# Patient Record
Sex: Male | Born: 1935 | Race: White | Hispanic: No | Smoking: Never smoker
Health system: Southern US, Community
[De-identification: ages and names within clinical notes are randomized; demographics above are authoritative.]

## PROBLEM LIST (undated history)

## (undated) DIAGNOSIS — F419 Anxiety disorder, unspecified: Secondary | ICD-10-CM

---

## 2012-12-27 DIAGNOSIS — R4182 Altered mental status, unspecified: Secondary | ICD-10-CM

## 2018-03-16 ENCOUNTER — Inpatient Hospital Stay (HOSPITAL_COMMUNITY)
Admission: EM | Admit: 2018-03-16 | Discharge: 2018-03-22 | DRG: 918 | Disposition: A | Discharge status: Skilled Nursing Facility | Payer: Medicare Other | Attending: Internal Medicine | Admitting: Internal Medicine

## 2018-03-16 DIAGNOSIS — D696 Thrombocytopenia, unspecified: Secondary | ICD-10-CM | POA: Diagnosis present

## 2018-03-16 DIAGNOSIS — D649 Anemia, unspecified: Secondary | ICD-10-CM

## 2018-03-16 DIAGNOSIS — Z23 Encounter for immunization: Secondary | ICD-10-CM

## 2018-03-16 DIAGNOSIS — F17290 Nicotine dependence, other tobacco product, uncomplicated: Secondary | ICD-10-CM | POA: Diagnosis present

## 2018-03-16 DIAGNOSIS — E162 Hypoglycemia, unspecified: Secondary | ICD-10-CM | POA: Diagnosis not present

## 2018-03-16 DIAGNOSIS — Z6830 Body mass index (BMI) 30.0-30.9, adult: Secondary | ICD-10-CM

## 2018-03-16 DIAGNOSIS — N185 Chronic kidney disease, stage 5: Secondary | ICD-10-CM

## 2018-03-16 DIAGNOSIS — E785 Hyperlipidemia, unspecified: Secondary | ICD-10-CM | POA: Diagnosis present

## 2018-03-16 DIAGNOSIS — D631 Anemia in chronic kidney disease: Secondary | ICD-10-CM | POA: Diagnosis present

## 2018-03-16 DIAGNOSIS — E1122 Type 2 diabetes mellitus with diabetic chronic kidney disease: Secondary | ICD-10-CM | POA: Diagnosis present

## 2018-03-16 DIAGNOSIS — Z7901 Long term (current) use of anticoagulants: Secondary | ICD-10-CM

## 2018-03-16 DIAGNOSIS — I132 Hypertensive heart and chronic kidney disease with heart failure and with stage 5 chronic kidney disease, or end stage renal disease: Secondary | ICD-10-CM | POA: Diagnosis present

## 2018-03-16 DIAGNOSIS — T383X1A Poisoning by insulin and oral hypoglycemic [antidiabetic] drugs, accidental (unintentional), initial encounter: Principal | ICD-10-CM | POA: Diagnosis present

## 2018-03-16 DIAGNOSIS — F32A Depression, unspecified: Secondary | ICD-10-CM

## 2018-03-16 DIAGNOSIS — Z7984 Long term (current) use of oral hypoglycemic drugs: Secondary | ICD-10-CM

## 2018-03-16 DIAGNOSIS — I5032 Chronic diastolic (congestive) heart failure: Secondary | ICD-10-CM | POA: Diagnosis present

## 2018-03-16 DIAGNOSIS — J449 Chronic obstructive pulmonary disease, unspecified: Secondary | ICD-10-CM | POA: Diagnosis present

## 2018-03-16 DIAGNOSIS — I10 Essential (primary) hypertension: Secondary | ICD-10-CM

## 2018-03-16 DIAGNOSIS — I272 Pulmonary hypertension, unspecified: Secondary | ICD-10-CM | POA: Diagnosis present

## 2018-03-16 DIAGNOSIS — F329 Major depressive disorder, single episode, unspecified: Secondary | ICD-10-CM | POA: Diagnosis present

## 2018-03-16 DIAGNOSIS — E11649 Type 2 diabetes mellitus with hypoglycemia without coma: Secondary | ICD-10-CM | POA: Diagnosis present

## 2018-03-16 DIAGNOSIS — N400 Enlarged prostate without lower urinary tract symptoms: Secondary | ICD-10-CM | POA: Diagnosis present

## 2018-03-16 DIAGNOSIS — I480 Paroxysmal atrial fibrillation: Secondary | ICD-10-CM | POA: Diagnosis present

## 2018-03-16 DIAGNOSIS — Z993 Dependence on wheelchair: Secondary | ICD-10-CM

## 2018-03-16 DIAGNOSIS — G8929 Other chronic pain: Secondary | ICD-10-CM | POA: Diagnosis present

## 2018-03-16 DIAGNOSIS — I509 Heart failure, unspecified: Secondary | ICD-10-CM

## 2018-03-16 LAB — CBC
HCT: 32 % — ABNORMAL LOW (ref 39.0–52.0)
HEMOGLOBIN: 9.5 g/dL — AB (ref 13.0–17.0)
MCH: 32.5 pg (ref 26.0–34.0)
MCHC: 29.7 g/dL — AB (ref 30.0–36.0)
MCV: 109.6 fL — ABNORMAL HIGH (ref 80.0–100.0)
Platelets: 121 10*3/uL — ABNORMAL LOW (ref 150–400)
RBC: 2.92 MIL/uL — ABNORMAL LOW (ref 4.22–5.81)
RDW: 13.5 % (ref 11.5–15.5)
WBC: 6.8 10*3/uL (ref 4.0–10.5)
nRBC: 0 % (ref 0.0–0.2)

## 2018-03-16 LAB — COMPREHENSIVE METABOLIC PANEL
ALBUMIN: 3.1 g/dL — AB (ref 3.5–5.0)
ALK PHOS: 90 U/L (ref 38–126)
ALT: 22 U/L (ref 0–44)
ANION GAP: 7 (ref 5–15)
AST: 38 U/L (ref 15–41)
BUN: 67 mg/dL — ABNORMAL HIGH (ref 8–23)
CALCIUM: 8.6 mg/dL — AB (ref 8.9–10.3)
CHLORIDE: 103 mmol/L (ref 98–111)
CO2: 28 mmol/L (ref 22–32)
Creatinine, Ser: 4.55 mg/dL — ABNORMAL HIGH (ref 0.61–1.24)
GFR calc Af Amer: 12 mL/min — ABNORMAL LOW (ref >60)
GFR calc non Af Amer: 11 mL/min — ABNORMAL LOW (ref >60)
GLUCOSE: 87 mg/dL (ref 70–99)
Potassium: 4 mmol/L (ref 3.5–5.1)
SODIUM: 138 mmol/L (ref 135–145)
Total Bilirubin: 0.6 mg/dL (ref 0.3–1.2)
Total Protein: 6.1 g/dL — ABNORMAL LOW (ref 6.5–8.1)

## 2018-03-16 LAB — CBG MONITORING, ED
GLUCOSE-CAPILLARY: 60 mg/dL — AB (ref 70–99)
Glucose-Capillary: 41 mg/dL — CL (ref 70–99)
Glucose-Capillary: 66 mg/dL — ABNORMAL LOW (ref 70–99)

## 2018-03-16 MED ORDER — SODIUM CHLORIDE 0.9 % IV SOLN: 250.0000 mL | INTRAVENOUS | Status: DC | PRN

## 2018-03-16 MED ORDER — DEXTROSE 10 % IV SOLN
INTRAVENOUS | Status: DC
  Administered 2018-03-16: 23:00:00 via INTRAVENOUS

## 2018-03-16 MED ORDER — DEXTROSE 50 % IV SOLN
25.0000 mL | Freq: Once | INTRAVENOUS | Status: AC
  Administered 2018-03-16: 25 mL via INTRAVENOUS
  Filled 2018-03-16: qty 50

## 2018-03-16 MED ORDER — SODIUM CHLORIDE 0.9% FLUSH
3.0000 mL | Freq: Two times a day (BID) | INTRAVENOUS | Status: DC
  Administered 2018-03-17 – 2018-03-22 (×11): 3 mL via INTRAVENOUS

## 2018-03-16 MED ORDER — SODIUM CHLORIDE 0.9% FLUSH: 3.0000 mL | INTRAVENOUS | Status: DC | PRN

## 2018-03-16 NOTE — ED Triage Notes (Signed)
Pt from home with [facility] for hypoglycemia. Family called 911 due to pt unconscious. CBg 36 upon ems arrival. Pt given 25G Dextrose.. Increased to 140s, en route CBG dropped to 60, pt on D10 gtt upon arrival to ED. CBG 60 in ED. Pt a.ox4 at this time. Given oral glucose.    BP178/104 Hr70

## 2018-03-16 NOTE — ED Provider Notes (Signed)
[place] EMERGENCY DEPARTMENT Provider Note   CSN: [id number] Arrival date & time: 03/16/18  2103     History   Chief Complaint Chief Complaint  Patient presents with  . Hypoglycemia    HPI Justin Christensen is a 84 y.o. male.  HPI She presented to the emergency room for evaluation of hypoglycemia.  Patient lives at home with his daughter.  According to the EMS report the patient was unresponsive.  EMS checked his CBG and it was 36.  They administered 25 g of dextrose.  Patient's blood sugar increased to the 140s but it started decreasing down to 60.  They started D10 drip.  In the ED the patient's blood sugar was 60.  Patient denies any complaints today.  He states he was feeling fine.  He denies any nausea vomiting or diarrhea.  He denies any chest pain, fevers or shortness of breath.  Patient states he has been taking his medications regularly  PMHX: Renal failure, congestive heart failure, chronic kidney disease, diabetes   Social history: Patient smokes a pipe, patient does not drink alcohol      Home Medications    Prior to Admission medications   Medication Sig Start Date End Date Taking? Authorizing Provider  acetaminophen (TYLENOL) 325 MG tablet Take 325 mg by mouth every 6 (six) hours as needed (for pain or headaches).   Yes [provider]  albuterol (PROVENTIL HFA) 108 (90 Base) MCG/ACT inhaler Inhale 1-2 puffs into the lungs every 6 (six) hours as needed for wheezing or shortness of breath.   Yes [provider]  albuterol (PROVENTIL) (2.5 MG/3ML) 0.083% nebulizer solution Take 2.5 mg by nebulization every 6 (six) hours as needed for wheezing or shortness of breath.   Yes [provider]  apixaban (ELIQUIS) 2.5 MG TABS tablet Take 2.5 mg by mouth 2 (two) times daily.   Yes [provider]  DULoxetine (CYMBALTA) 30 MG capsule Take 30 mg by mouth daily.   Yes [provider]  glipiZIDE (GLUCOTROL) 10 MG  tablet Take 10 mg by mouth 2 (two) times daily before a meal.   Yes [provider]  hydrALAZINE (APRESOLINE) 25 MG tablet Take 25 mg by mouth 3 (three) times daily.   Yes [provider]  HYDROcodone-acetaminophen (NORCO/VICODIN) 5-325 MG tablet Take 1 tablet by mouth every 8 (eight) hours as needed (for pain).   Yes [provider]  Multiple Vitamins-Minerals (ONE-A-DAY MENS 50+ ADVANTAGE) TABS Take 1 tablet by mouth daily.   Yes [provider]  simethicone (MYLICON) 80 MG chewable tablet Chew 80 mg by mouth every 6 (six) hours as needed for flatulence.   Yes [provider]  simvastatin (ZOCOR) 40 MG tablet Take 40 mg by mouth at bedtime.   Yes [provider]  terazosin (HYTRIN) 5 MG capsule Take 5 mg by mouth at bedtime.   Yes [provider]  Tiotropium Bromide Monohydrate (SPIRIVA RESPIMAT) 1.25 MCG/ACT AERS Inhale 1 puff into the lungs daily.   Yes [provider]  methocarbamol (ROBAXIN) 500 MG tablet Take 500 mg by mouth 3 (three) times daily as needed for muscle spasms.    [provider]  Home medications Per last discharge summary in care everywhere, hydralazine, Eliquis, Cymbalta, Glucotrol, Norco, Robaxin, Zocor Spiriva, Mylicon, testerone  Family History No family history on file.   Allergies   Patient has no known allergies.   Review of Systems Review of Systems  All other systems  reviewed and are negative.    Physical Exam Updated Vital Signs BP (!) 162/94   Pulse 64   Temp (!) 97.4 F (36.3 C) (Oral)   Resp 19   SpO2 100%   Physical Exam  Constitutional:  Really, frail  HENT:  Head: Normocephalic and atraumatic.  Right Ear: External ear normal.  Left Ear: External ear normal.  Eyes: Conjunctivae are normal. Right eye exhibits no discharge. Left eye exhibits no discharge. No scleral icterus.  Neck: Neck supple. No tracheal deviation present.  Cardiovascular: Normal rate,  regular rhythm and intact distal pulses.  Pulmonary/Chest: Effort normal and breath sounds normal. No stridor. No respiratory distress. He has no wheezes. He has no rales.  Abdominal: Soft. Bowel sounds are normal. He exhibits no distension. There is no tenderness. There is no rebound and no guarding.  Musculoskeletal: He exhibits edema ( Lateral lower extremities). He exhibits no tenderness.  Neurological: He is alert. He has normal strength. No cranial nerve deficit (no facial droop, extraocular movements intact, no slurred speech) or sensory deficit. He exhibits normal muscle tone. He displays no seizure activity. Coordination normal.  Skin: Skin is warm and dry. No rash noted.  Small petechial lesions in the skin  Psychiatric: He has a normal mood and affect.  Nursing note and vitals reviewed.    ED Treatments / Results  Labs (all labs ordered are listed, but only abnormal results are displayed) Labs Reviewed  CBC - Abnormal; Notable for the following components:      Result Value   RBC 2.92 (*)    Hemoglobin 9.5 (*)    HCT 32.0 (*)    MCV 109.6 (*)    MCHC 29.7 (*)    Platelets 121 (*)    All other components within normal limits  COMPREHENSIVE METABOLIC PANEL - Abnormal; Notable for the following components:   BUN 67 (*)    Creatinine, Ser 4.55 (*)    Calcium 8.6 (*)    Total Protein 6.1 (*)    Albumin 3.1 (*)    GFR calc non Af Amer 11 (*)    GFR calc Af Amer 12 (*)    All other components within normal limits  CBG MONITORING, ED - Abnormal; Notable for the following components:   Glucose-Capillary 60 (*)    All other components within normal limits  CBG MONITORING, ED - Abnormal; Notable for the following components:   Glucose-Capillary 41 (*)    All other components within normal limits  CBG MONITORING, ED  CBG MONITORING, ED    EKG None  Radiology No results found.  Procedures .Critical Care Performed by: Dorie Rank, MD Authorized by: Dorie Rank, MD    Critical care provider statement:    Critical care time (minutes):  45   Critical care was time spent personally by me on the following activities:  Discussions with consultants, evaluation of patient's response to treatment, examination of patient, ordering and performing treatments and interventions, ordering and review of laboratory studies, ordering and review of radiographic studies, pulse oximetry, re-evaluation of patient's condition, obtaining history from patient or surrogate and review of old charts   (including critical care time)  Medications Ordered in ED Medications  sodium chloride flush (NS) 0.9 % injection 3 mL (3 mLs Intravenous Not Given 03/16/18 2232)  sodium chloride flush (NS) 0.9 % injection 3 mL (has no administration in time range)  0.9 %  sodium chloride infusion (has no administration in time range)  dextrose 10 %  infusion ( Intravenous New Bag/Given 03/16/18 2242)  dextrose 50 % solution 25 mL (25 mLs Intravenous Given 03/16/18 2240)     Initial Impression / Assessment and Plan / ED Course  I have reviewed the triage vital signs and the nursing notes.  Pertinent labs & imaging results that were available during my care of the patient were reviewed by me and considered in my medical decision making (see chart for details).  Clinical Course as of Mar 16 2313  Tue Mar 16, 2018  2219 October 5 BUN and creatinine were 78/5   [JK]  2220 Hemoglobin was 10 on October 1   [JK]  2235 Blood sugar down to 40 again.  Will give d50 and start dextrose infusion again.   [JK]    Clinical Course User Index [JK] Dorie Rank, MD   Patient presented to the emergency room for evaluation of hypoglycemia.  Patient was recently in the hospital and had a change in his medications.  Patient's family indicated that he was hypoglycemic earlier this morning.  They did give him his diabetes medications following that he was eating and doing fine.  Patient had another episode of  hypoglycemia evening.  Patient unfortunately has remained hypoglycemic and has required IV insulin infusions.  Because of his persistent hypoglycemia and chronic renal failure I will consult the medical service for admission so we can continue to monitor his blood sugar overnight.  He is at risk for prolonged hypoglycemia  Final Clinical Impressions(s) / ED Diagnoses   Final diagnoses:  Hypoglycemia      Dorie Rank, MD 03/16/18 2315

## 2018-03-17 ENCOUNTER — Other Ambulatory Visit: Payer: Self-pay

## 2018-03-17 ENCOUNTER — Encounter (HOSPITAL_COMMUNITY): Payer: Self-pay

## 2018-03-17 DIAGNOSIS — I10 Essential (primary) hypertension: Secondary | ICD-10-CM

## 2018-03-17 DIAGNOSIS — D649 Anemia, unspecified: Secondary | ICD-10-CM

## 2018-03-17 DIAGNOSIS — N400 Enlarged prostate without lower urinary tract symptoms: Secondary | ICD-10-CM

## 2018-03-17 DIAGNOSIS — D696 Thrombocytopenia, unspecified: Secondary | ICD-10-CM

## 2018-03-17 DIAGNOSIS — N185 Chronic kidney disease, stage 5: Secondary | ICD-10-CM

## 2018-03-17 DIAGNOSIS — I480 Paroxysmal atrial fibrillation: Secondary | ICD-10-CM

## 2018-03-17 DIAGNOSIS — E162 Hypoglycemia, unspecified: Secondary | ICD-10-CM

## 2018-03-17 DIAGNOSIS — J449 Chronic obstructive pulmonary disease, unspecified: Secondary | ICD-10-CM

## 2018-03-17 DIAGNOSIS — I509 Heart failure, unspecified: Secondary | ICD-10-CM

## 2018-03-17 DIAGNOSIS — E785 Hyperlipidemia, unspecified: Secondary | ICD-10-CM

## 2018-03-17 DIAGNOSIS — G8929 Other chronic pain: Secondary | ICD-10-CM

## 2018-03-17 DIAGNOSIS — F32A Depression, unspecified: Secondary | ICD-10-CM

## 2018-03-17 DIAGNOSIS — F329 Major depressive disorder, single episode, unspecified: Secondary | ICD-10-CM

## 2018-03-17 LAB — GLUCOSE, CAPILLARY
GLUCOSE-CAPILLARY: 131 mg/dL — AB (ref 70–99)
GLUCOSE-CAPILLARY: 209 mg/dL — AB (ref 70–99)
GLUCOSE-CAPILLARY: 246 mg/dL — AB (ref 70–99)
GLUCOSE-CAPILLARY: 202 mg/dL — AB (ref 70–99)
Glucose-Capillary: 126 mg/dL — ABNORMAL HIGH (ref 70–99)
Glucose-Capillary: 251 mg/dL — ABNORMAL HIGH (ref 70–99)
Glucose-Capillary: 237 mg/dL — ABNORMAL HIGH (ref 70–99)

## 2018-03-17 LAB — CBG MONITORING, ED
GLUCOSE-CAPILLARY: 107 mg/dL — AB (ref 70–99)
GLUCOSE-CAPILLARY: 47 mg/dL — AB (ref 70–99)
Glucose-Capillary: 156 mg/dL — ABNORMAL HIGH (ref 70–99)
Glucose-Capillary: 59 mg/dL — ABNORMAL LOW (ref 70–99)
Glucose-Capillary: 59 mg/dL — ABNORMAL LOW (ref 70–99)
Glucose-Capillary: 80 mg/dL (ref 70–99)
Glucose-Capillary: 94 mg/dL (ref 70–99)

## 2018-03-17 LAB — MRSA PCR SCREENING: MRSA BY PCR: POSITIVE — AB

## 2018-03-17 LAB — HEMOGLOBIN A1C
Hgb A1c MFr Bld: 7.2 % — ABNORMAL HIGH (ref 4.8–5.6)
MEAN PLASMA GLUCOSE: 159.94 mg/dL

## 2018-03-17 MED ORDER — OCTREOTIDE ACETATE 50 MCG/ML IJ SOLN
50.0000 ug | Freq: Four times a day (QID) | INTRAMUSCULAR | Status: AC
  Administered 2018-03-17 (×2): 50 ug via SUBCUTANEOUS
  Filled 2018-03-17 (×3): qty 1

## 2018-03-17 MED ORDER — ADULT MULTIVITAMIN W/MINERALS CH
1.0000 | ORAL_TABLET | Freq: Every day | ORAL | Status: DC
  Administered 2018-03-17 – 2018-03-22 (×6): 1 via ORAL
  Filled 2018-03-17 (×6): qty 1

## 2018-03-17 MED ORDER — BOOST / RESOURCE BREEZE PO LIQD CUSTOM
1.0000 | Freq: Three times a day (TID) | ORAL | Status: DC
  Administered 2018-03-17: 1 via ORAL

## 2018-03-17 MED ORDER — DEXTROSE 50 % IV SOLN
INTRAVENOUS | Status: AC
  Administered 2018-03-17: 50 mL
  Filled 2018-03-17: qty 50

## 2018-03-17 MED ORDER — HYDRALAZINE HCL 25 MG PO TABS
25.0000 mg | ORAL_TABLET | Freq: Three times a day (TID) | ORAL | Status: DC
  Administered 2018-03-17 – 2018-03-18 (×4): 25 mg via ORAL
  Filled 2018-03-17 (×4): qty 1

## 2018-03-17 MED ORDER — INSULIN ASPART 100 UNIT/ML ~~LOC~~ SOLN
0.0000 [IU] | Freq: Three times a day (TID) | SUBCUTANEOUS | Status: DC
  Administered 2018-03-18 (×2): 3 [IU] via SUBCUTANEOUS
  Administered 2018-03-18 – 2018-03-19 (×3): 2 [IU] via SUBCUTANEOUS
  Administered 2018-03-20: 3 [IU] via SUBCUTANEOUS
  Administered 2018-03-20: 1 [IU] via SUBCUTANEOUS
  Administered 2018-03-20 – 2018-03-21 (×2): 3 [IU] via SUBCUTANEOUS
  Administered 2018-03-21: 5 [IU] via SUBCUTANEOUS
  Administered 2018-03-21 – 2018-03-22 (×2): 3 [IU] via SUBCUTANEOUS

## 2018-03-17 MED ORDER — ACETAMINOPHEN 325 MG PO TABS: 325.0000 mg | ORAL_TABLET | Freq: Four times a day (QID) | ORAL | Status: DC | PRN

## 2018-03-17 MED ORDER — HYDROCODONE-ACETAMINOPHEN 5-325 MG PO TABS
1.0000 | ORAL_TABLET | Freq: Three times a day (TID) | ORAL | Status: DC | PRN
  Administered 2018-03-17 – 2018-03-22 (×3): 1 via ORAL
  Filled 2018-03-17 (×3): qty 1

## 2018-03-17 MED ORDER — SIMVASTATIN 40 MG PO TABS
40.0000 mg | ORAL_TABLET | Freq: Every day | ORAL | Status: DC
  Administered 2018-03-17 – 2018-03-21 (×4): 40 mg via ORAL
  Filled 2018-03-17 (×6): qty 1

## 2018-03-17 MED ORDER — CHLORHEXIDINE GLUCONATE CLOTH 2 % EX PADS
6.0000 | MEDICATED_PAD | Freq: Every day | CUTANEOUS | Status: AC
  Administered 2018-03-17 – 2018-03-21 (×5): 6 via TOPICAL

## 2018-03-17 MED ORDER — SIMETHICONE 80 MG PO CHEW
80.0000 mg | CHEWABLE_TABLET | Freq: Four times a day (QID) | ORAL | Status: DC | PRN
  Filled 2018-03-17: qty 1

## 2018-03-17 MED ORDER — GLUCERNA SHAKE PO LIQD
237.0000 mL | Freq: Three times a day (TID) | ORAL | Status: DC
  Administered 2018-03-17 – 2018-03-22 (×11): 237 mL via ORAL

## 2018-03-17 MED ORDER — DEXTROSE 50 % IV SOLN
INTRAVENOUS | Status: AC
  Filled 2018-03-17: qty 50

## 2018-03-17 MED ORDER — DEXTROSE 10 % IV SOLN
INTRAVENOUS | Status: DC
  Administered 2018-03-17 (×2): via INTRAVENOUS

## 2018-03-17 MED ORDER — APIXABAN 2.5 MG PO TABS
2.5000 mg | ORAL_TABLET | Freq: Two times a day (BID) | ORAL | Status: DC
  Administered 2018-03-17 – 2018-03-22 (×11): 2.5 mg via ORAL
  Filled 2018-03-17 (×14): qty 1

## 2018-03-17 MED ORDER — TIOTROPIUM BROMIDE MONOHYDRATE 18 MCG IN CAPS
1.0000 | ORAL_CAPSULE | Freq: Every day | RESPIRATORY_TRACT | Status: DC
  Administered 2018-03-17 – 2018-03-20 (×4): 18 ug via RESPIRATORY_TRACT
  Filled 2018-03-17: qty 5

## 2018-03-17 MED ORDER — MUPIROCIN 2 % EX OINT
1.0000 "application " | TOPICAL_OINTMENT | Freq: Two times a day (BID) | CUTANEOUS | Status: AC
  Administered 2018-03-17 – 2018-03-21 (×10): 1 via NASAL
  Filled 2018-03-17 (×5): qty 22

## 2018-03-17 MED ORDER — INFLUENZA VAC SPLIT HIGH-DOSE 0.5 ML IM SUSY
0.5000 mL | PREFILLED_SYRINGE | INTRAMUSCULAR | Status: AC
  Administered 2018-03-18: 0.5 mL via INTRAMUSCULAR
  Filled 2018-03-17: qty 0.5

## 2018-03-17 MED ORDER — ORAL CARE MOUTH RINSE
15.0000 mL | Freq: Two times a day (BID) | OROMUCOSAL | Status: DC
  Administered 2018-03-17 – 2018-03-22 (×10): 15 mL via OROMUCOSAL

## 2018-03-17 MED ORDER — DEXTROSE 50 % IV SOLN
25.0000 mL | Freq: Once | INTRAVENOUS | Status: AC
  Administered 2018-03-17: 25 mL via INTRAVENOUS
  Filled 2018-03-17: qty 50

## 2018-03-17 MED ORDER — ALBUTEROL SULFATE (2.5 MG/3ML) 0.083% IN NEBU: 2.5000 mg | INHALATION_SOLUTION | Freq: Four times a day (QID) | RESPIRATORY_TRACT | Status: DC | PRN

## 2018-03-17 MED ORDER — PNEUMOCOCCAL VAC POLYVALENT 25 MCG/0.5ML IJ INJ
0.5000 mL | INJECTION | INTRAMUSCULAR | Status: AC
  Administered 2018-03-18: 0.5 mL via INTRAMUSCULAR
  Filled 2018-03-17: qty 0.5

## 2018-03-17 MED ORDER — DEXTROSE 50 % IV SOLN
1.0000 | Freq: Once | INTRAVENOUS | Status: AC
  Administered 2018-03-17: 50 mL via INTRAVENOUS

## 2018-03-17 MED ORDER — INSULIN ASPART 100 UNIT/ML ~~LOC~~ SOLN
0.0000 [IU] | Freq: Every day | SUBCUTANEOUS | Status: DC
  Administered 2018-03-20: 2 [IU] via SUBCUTANEOUS

## 2018-03-17 MED ORDER — TERAZOSIN HCL 5 MG PO CAPS
5.0000 mg | ORAL_CAPSULE | Freq: Every day | ORAL | Status: DC
  Administered 2018-03-17 – 2018-03-21 (×5): 5 mg via ORAL
  Filled 2018-03-17 (×7): qty 1

## 2018-03-17 MED ORDER — DULOXETINE HCL 30 MG PO CPEP
30.0000 mg | ORAL_CAPSULE | Freq: Every day | ORAL | Status: DC
  Administered 2018-03-17 – 2018-03-22 (×6): 30 mg via ORAL
  Filled 2018-03-17 (×6): qty 1

## 2018-03-17 NOTE — ED Notes (Signed)
Ordered breakfast 

## 2018-03-17 NOTE — H&P (Addendum)
History and Physical    Justin Christensen POE:[id number] DOB: 06/07/1931 DOA: 03/16/2018  PCP: [name] Patient coming from: Home  Chief Complaint: Patient is here for evaluation of low blood glucose readings at home.  HPI: Justin Christensen is a 84 y.o. male with medical history significant of type 2 diabetes, CHF, CKD stage V, hypertension, paroxysmal A. fib, COPD presenting to the hospital for further evaluation of low blood glucose readings at home.  Patient states he is feeling fine and does not know why he is here.  He thinks his daughter must have called EMS.  He denies having any headaches, palpitations, or dizziness.  Denies having any chest pain, shortness of breath, abdominal pain, nausea, vomiting, or diarrhea.  His home glipizide dose is 10 mg tablet 2 times a day.  Patient reports taking a much higher dose - 3 tablets of glipizide in the morning and 2 tablets in the afternoon.  Unclear for how long he has been on this dose.  Per chart review under care every where, patient was recently admitted at [facility] for acute kidney failure in the setting of CKD stage V.  ED Course: Per ED documentation, EMS found the patient unresponsive at home and his CBG was 36.  They administered 25 g of dextrose.  His blood glucose increased to 140s but then it started decreasing again down to 60.  Started D10 drip.  CBG on arrival 60. In the ED, patient's blood glucose continued to drop despite patient being on D10 infusion, receiving D50, and eating. [facility] called to admit.  Review of Systems: As per HPI otherwise 10 point review of systems negative.  Past medical history Type 2 diabetes, CHF, CKD stage V, hypertension, paroxysmal A. fib, COPD    Social history Unable to obtain from patient at this time.  Patient is not completely oriented.  has no tobacco, alcohol, and drug history on file.  No Known Allergies  Family history Unable to obtain from patient at this time.  Patient is not  completely oriented. No family history on file.  Prior to Admission medications   Medication Sig Start Date End Date Taking? Authorizing Provider  acetaminophen (TYLENOL) 325 MG tablet Take 325 mg by mouth every 6 (six) hours as needed (for pain or headaches).   Yes [provider]  albuterol (PROVENTIL HFA) 108 (90 Base) MCG/ACT inhaler Inhale 1-2 puffs into the lungs every 6 (six) hours as needed for wheezing or shortness of breath.   Yes [provider]  albuterol (PROVENTIL) (2.5 MG/3ML) 0.083% nebulizer solution Take 2.5 mg by nebulization every 6 (six) hours as needed for wheezing or shortness of breath.   Yes [provider]  apixaban (ELIQUIS) 2.5 MG TABS tablet Take 2.5 mg by mouth 2 (two) times daily.   Yes [provider]  DULoxetine (CYMBALTA) 30 MG capsule Take 30 mg by mouth daily.   Yes [provider]  glipiZIDE (GLUCOTROL) 10 MG tablet Take 10 mg by mouth 2 (two) times daily before a meal.   Yes [provider]  hydrALAZINE (APRESOLINE) 25 MG tablet Take 25 mg by mouth 3 (three) times daily.   Yes [provider]  HYDROcodone-acetaminophen (NORCO/VICODIN) 5-325 MG tablet Take 1 tablet by mouth every 8 (eight) hours as needed (for pain).   Yes [provider]  Multiple Vitamins-Minerals (ONE-A-DAY MENS 50+ ADVANTAGE) TABS Take 1 tablet by mouth daily.   Yes [provider]  simethicone (MYLICON) 80 MG chewable  tablet Chew 80 mg by mouth every 6 (six) hours as needed for flatulence.   Yes [provider]  simvastatin (ZOCOR) 40 MG tablet Take 40 mg by mouth at bedtime.   Yes [provider]  terazosin (HYTRIN) 5 MG capsule Take 5 mg by mouth at bedtime.   Yes [provider]  Tiotropium Bromide Monohydrate (SPIRIVA RESPIMAT) 1.25 MCG/ACT AERS Inhale 1 puff into the lungs daily.   Yes [provider]  methocarbamol (ROBAXIN) 500 MG tablet Take 500 mg by mouth 3 (three)  times daily as needed for muscle spasms.    [provider]    Physical Exam: Vitals:   03/17/18 0000 03/17/18 0001 03/17/18 0030 03/17/18 0100  BP: (!) 179/68  (!) 158/79 (!) 166/95  Pulse:  61 64 84  Resp: 20 20 19  (!) 22  Temp:      TempSrc:      SpO2:  99% 98% 96%   Physical Exam  Constitutional: No distress.  Resting comfortably in hospital stretcher  HENT:  Mouth/Throat: Oropharynx is clear and moist.  Eyes: Right eye exhibits no discharge. Left eye exhibits no discharge.  Neck: Neck supple. No tracheal deviation present.  Cardiovascular: Normal rate, regular rhythm and intact distal pulses.  Pulmonary/Chest: Effort normal and breath sounds normal. No respiratory distress. He has no wheezes. He has no rales.  Abdominal: Soft. Bowel sounds are normal. He exhibits no distension. There is no tenderness.  Musculoskeletal: He exhibits no edema.  Neurological: He is alert.  Oriented to person.  He knows it is 2019. (Unclear what his baseline is) Answering questions appropriately Following commands  Skin: Skin is warm and dry.  Psychiatric: His behavior is normal.   Labs on Admission: I have personally reviewed following labs and imaging studies  CBC: Recent Labs  Lab 03/16/18 2115  WBC 6.8  HGB 9.5*  HCT 32.0*  MCV 109.6*  PLT 856*   Basic Metabolic Panel: Recent Labs  Lab 03/16/18 2115  NA 138  K 4.0  CL 103  CO2 28  GLUCOSE 87  BUN 67*  CREATININE 4.55*  CALCIUM 8.6*   GFR: CrCl cannot be calculated (Unknown ideal weight.). Liver Function Tests: Recent Labs  Lab 03/16/18 2115  AST 38  ALT 22  ALKPHOS 90  BILITOT 0.6  PROT 6.1*  ALBUMIN 3.1*   No results for input(s): LIPASE, AMYLASE in the last 168 hours. No results for input(s): AMMONIA in the last 168 hours. Coagulation Profile: No results for input(s): INR, PROTIME in the last 168 hours. Cardiac Enzymes: No results for input(s): CKTOTAL, CKMB, CKMBINDEX, TROPONINI in the last 168  hours. BNP (last 3 results) No results for input(s): PROBNP in the last 8760 hours. HbA1C: No results for input(s): HGBA1C in the last 72 hours. CBG: Recent Labs  Lab 03/16/18 2107 03/16/18 2232 03/16/18 2343 03/17/18 0055  GLUCAP 60* 41* 66* 59*   Lipid Profile: No results for input(s): CHOL, HDL, LDLCALC, TRIG, CHOLHDL, LDLDIRECT in the last 72 hours. Thyroid Function Tests: No results for input(s): TSH, T4TOTAL, FREET4, T3FREE, THYROIDAB in the last 72 hours. Anemia Panel: No results for input(s): VITAMINB12, FOLATE, FERRITIN, TIBC, IRON, RETICCTPCT in the last 72 hours. Urine analysis: No results found for: COLORURINE, APPEARANCEUR, LABSPEC, PHURINE, GLUCOSEU, HGBUR, BILIRUBINUR, KETONESUR, PROTEINUR, UROBILINOGEN, NITRITE, LEUKOCYTESUR  Radiological Exams on Admission: No results found.  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  He is currently taking a much higher dose of glipizide at home than what is prescribed.  Although not sure how reliable the history is as patient is not completely oriented.  This needs to be discussed further when family is present. -D10 infusion rate has been increased to 150 cc/hr -Regular diet -CBG checks every 2 hours -Hold glipizide  CKD stage V -Creatinine stable at 4.5.  It was 5.0 on October 5 at the time of his hospital discharge from [place].  Hypertension Blood pressure currently elevated. -Continue home hydralazine  Hyperlipidemia -Continue home Zocor  Paroxysmal A. Fib CHA2DS2VASc 5.  -Hold atenolol as it caused significant bradycardia during his recent hospitalization at [facility].  This medication was discontinued during discharge. -Continue home Eliquis -EKG  COPD Stable.  Not wheezing on exam. -Continue home inhalers  History of  congestive heart failure, diastolic? Echo done on March 05, 2018 under care everywhere showing normal systolic function.  Study was insufficient to adequately determine diastolic dysfunction.  Patient has +2 pitting edema bilateral lower extremities but lungs clear on exam. -Continue to monitor -Beta-blocker was stopped during his recent hospitalization due to significant bradycardia  Chronic anemia Hemoglobin 9.5 (stable). Baseline 9-10 per chart review under care everywhere.  Thrombocytopenia Platelets 121,000; (stable) disease.  Platelet count was 108,000 during his recent hospitalization at [facility].  BPH -Continue home terazosin  Depression -Continue home Cymbalta  Chronic pain -Continue home Norco   DVT prophylaxis: Eliquis Code Status: Full code.  Patient is not completely oriented at this time.  Unclear baseline mental status.  This needs to be discussed in the day when family is present. Family Communication: No family present at bedside. Disposition Plan: Anticipate discharge in 1 to 2 days to home. Consults called: None Admission status: Observation   Shela Leff MD [facility] Pager [phone]  If 7PM-7AM, please contact night-coverage [email or web address] Password [email or web address]  03/17/2018, 1:12 AM

## 2018-03-17 NOTE — ED Notes (Signed)
MD paged regarding pts CBG 59. Pt alert and drinking orange juice

## 2018-03-17 NOTE — Progress Notes (Signed)
Daughter reports she can not keep dad at her house anymore because she works and he gets confused. She is concerned for his safety and wants to look into skilled placement.   [name]- daughter [phone]

## 2018-03-17 NOTE — ED Notes (Signed)
Schorr, MD paged about blood sugar of 47

## 2018-03-17 NOTE — Progress Notes (Signed)
Initial Nutrition Assessment  DOCUMENTATION CODES:   Obesity unspecified  INTERVENTION:    Glucerna Shake po TID, each supplement provides 220 kcal and 10 grams of protein  Provide MVI daily  NUTRITION DIAGNOSIS:   Increased nutrient needs related to acute illness as evidenced by estimated needs  GOAL:   Patient will meet greater than or equal to 90% of their needs  MONITOR:   PO intake, Supplement acceptance, Labs, Weight trends, I & O's  REASON FOR ASSESSMENT:   Consult Assessment of nutrition requirement/status  ASSESSMENT:   Patient with PMH significant for DM, CHF, CKD stage V, CHF, HTN, HLD, paroxysmal A. fib, and COPD. 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    Pt confused at the time of RD visit. Unable to provide dietary recall or weight history. When asked how many meals he eats daily pt states "I eat all the time." When asked if he uses supplementation at home pt states "I use everything." Pt refused breakfast this morning. He consumed one Boost Breeze. RD provided pt with Glucerna to try, pt enjoyed it. Will attempt to obtain more nutrition history once mental status improves or if family arrives.   Weight history is limited in chart. Pt weighed 180 lb on 03/13/18 at [facility] and weighs 183 lb this admission. Do not suspect any recent wt loss. Nutrition-Focused physical exam completed.    Medications reviewed and include: MVI with minerals Labs reviewed: CBG 59-246   NUTRITION - FOCUSED PHYSICAL EXAM:    Most Recent Value  Orbital Region  No depletion  Upper Arm Region  No depletion  Thoracic and Lumbar Region  Unable to assess  Buccal Region  No depletion  Temple Region  No depletion  Clavicle Bone Region  No depletion  Clavicle and Acromion Bone Region  No depletion  Scapular Bone Region  Unable to assess  Dorsal Hand  No depletion  Patellar Region  No depletion  Anterior Thigh Region  No  depletion  Posterior Calf Region  No depletion  Edema (RD Assessment)  None     Diet Order:   Diet Order            Diet regular Room service appropriate? Yes; Fluid consistency: Thin  Diet effective now              EDUCATION NEEDS:   Not appropriate for education at this time  Skin:  Skin Assessment: Reviewed RN Assessment  Last BM:  03/16/18  Height:   Ht Readings from Last 1 Encounters:  03/17/18 5\' 5"  (1.651 m)    Weight:   Wt Readings from Last 1 Encounters:  03/17/18 82.9 kg    Ideal Body Weight:  61.8 kg  BMI:  Body mass index is 30.41 kg/m.  Estimated Nutritional Needs:   Kcal:  1800-2000 kcal  Protein:  90-105 grams  Fluid:  >/= 1.8 L/day   Mariana Single RD, LDN Clinical Nutrition Pager # - [phone]

## 2018-03-17 NOTE — ED Notes (Signed)
Schorr, NP paged

## 2018-03-17 NOTE — ED Notes (Signed)
Marlowe Sax, MD paged about sugar of 59. Was given order to increase rate of D10 to 150 mL/ hr and to give 1 amp of D50.

## 2018-03-17 NOTE — ED Notes (Signed)
CBG 59. RN Notified. Pt given 8oz orange juice.

## 2018-03-18 DIAGNOSIS — E1122 Type 2 diabetes mellitus with diabetic chronic kidney disease: Secondary | ICD-10-CM | POA: Diagnosis present

## 2018-03-18 DIAGNOSIS — I272 Pulmonary hypertension, unspecified: Secondary | ICD-10-CM | POA: Diagnosis present

## 2018-03-18 DIAGNOSIS — I480 Paroxysmal atrial fibrillation: Secondary | ICD-10-CM

## 2018-03-18 DIAGNOSIS — E785 Hyperlipidemia, unspecified: Secondary | ICD-10-CM | POA: Diagnosis present

## 2018-03-18 DIAGNOSIS — Z23 Encounter for immunization: Secondary | ICD-10-CM | POA: Diagnosis present

## 2018-03-18 DIAGNOSIS — N185 Chronic kidney disease, stage 5: Secondary | ICD-10-CM

## 2018-03-18 DIAGNOSIS — E11649 Type 2 diabetes mellitus with hypoglycemia without coma: Secondary | ICD-10-CM | POA: Diagnosis present

## 2018-03-18 DIAGNOSIS — N400 Enlarged prostate without lower urinary tract symptoms: Secondary | ICD-10-CM | POA: Diagnosis present

## 2018-03-18 DIAGNOSIS — F17290 Nicotine dependence, other tobacco product, uncomplicated: Secondary | ICD-10-CM | POA: Diagnosis present

## 2018-03-18 DIAGNOSIS — D631 Anemia in chronic kidney disease: Secondary | ICD-10-CM | POA: Diagnosis present

## 2018-03-18 DIAGNOSIS — G8929 Other chronic pain: Secondary | ICD-10-CM | POA: Diagnosis present

## 2018-03-18 DIAGNOSIS — D696 Thrombocytopenia, unspecified: Secondary | ICD-10-CM | POA: Diagnosis present

## 2018-03-18 DIAGNOSIS — E162 Hypoglycemia, unspecified: Secondary | ICD-10-CM | POA: Diagnosis present

## 2018-03-18 DIAGNOSIS — F329 Major depressive disorder, single episode, unspecified: Secondary | ICD-10-CM | POA: Diagnosis present

## 2018-03-18 DIAGNOSIS — T383X1A Poisoning by insulin and oral hypoglycemic [antidiabetic] drugs, accidental (unintentional), initial encounter: Secondary | ICD-10-CM | POA: Diagnosis present

## 2018-03-18 DIAGNOSIS — Z6830 Body mass index (BMI) 30.0-30.9, adult: Secondary | ICD-10-CM | POA: Diagnosis not present

## 2018-03-18 DIAGNOSIS — Z7984 Long term (current) use of oral hypoglycemic drugs: Secondary | ICD-10-CM | POA: Diagnosis not present

## 2018-03-18 DIAGNOSIS — I10 Essential (primary) hypertension: Secondary | ICD-10-CM | POA: Diagnosis not present

## 2018-03-18 DIAGNOSIS — I132 Hypertensive heart and chronic kidney disease with heart failure and with stage 5 chronic kidney disease, or end stage renal disease: Secondary | ICD-10-CM | POA: Diagnosis present

## 2018-03-18 DIAGNOSIS — J449 Chronic obstructive pulmonary disease, unspecified: Secondary | ICD-10-CM | POA: Diagnosis present

## 2018-03-18 DIAGNOSIS — Z7901 Long term (current) use of anticoagulants: Secondary | ICD-10-CM | POA: Diagnosis not present

## 2018-03-18 DIAGNOSIS — D649 Anemia, unspecified: Secondary | ICD-10-CM

## 2018-03-18 DIAGNOSIS — Z993 Dependence on wheelchair: Secondary | ICD-10-CM | POA: Diagnosis not present

## 2018-03-18 DIAGNOSIS — I5032 Chronic diastolic (congestive) heart failure: Secondary | ICD-10-CM | POA: Diagnosis present

## 2018-03-18 LAB — CBC
HCT: 28.4 % — ABNORMAL LOW (ref 39.0–52.0)
Hemoglobin: 8.4 g/dL — ABNORMAL LOW (ref 13.0–17.0)
MCH: 32.1 pg (ref 26.0–34.0)
MCHC: 29.6 g/dL — ABNORMAL LOW (ref 30.0–36.0)
MCV: 108.4 fL — ABNORMAL HIGH (ref 80.0–100.0)
NRBC: 0 % (ref 0.0–0.2)
PLATELETS: 122 10*3/uL — AB (ref 150–400)
RBC: 2.62 MIL/uL — AB (ref 4.22–5.81)
RDW: 13.2 % (ref 11.5–15.5)
WBC: 6.7 10*3/uL (ref 4.0–10.5)

## 2018-03-18 LAB — GLUCOSE, CAPILLARY
GLUCOSE-CAPILLARY: 191 mg/dL — AB (ref 70–99)
GLUCOSE-CAPILLARY: 135 mg/dL — AB (ref 70–99)
GLUCOSE-CAPILLARY: 120 mg/dL — AB (ref 70–99)
Glucose-Capillary: 154 mg/dL — ABNORMAL HIGH (ref 70–99)

## 2018-03-18 LAB — BASIC METABOLIC PANEL
ANION GAP: 11 (ref 5–15)
BUN: 62 mg/dL — ABNORMAL HIGH (ref 8–23)
CALCIUM: 8.7 mg/dL — AB (ref 8.9–10.3)
CO2: 26 mmol/L (ref 22–32)
Chloride: 100 mmol/L (ref 98–111)
Creatinine, Ser: 4.27 mg/dL — ABNORMAL HIGH (ref 0.61–1.24)
GFR, EST AFRICAN AMERICAN: 13 mL/min — AB (ref >60)
GFR, EST NON AFRICAN AMERICAN: 11 mL/min — AB (ref >60)
Glucose, Bld: 158 mg/dL — ABNORMAL HIGH (ref 70–99)
Potassium: 4.2 mmol/L (ref 3.5–5.1)
SODIUM: 137 mmol/L (ref 135–145)

## 2018-03-18 MED ORDER — METOPROLOL TARTRATE 5 MG/5ML IV SOLN
5.0000 mg | INTRAVENOUS | Status: DC | PRN
  Administered 2018-03-18: 5 mg via INTRAVENOUS
  Filled 2018-03-18: qty 5

## 2018-03-18 MED ORDER — HYDRALAZINE HCL 50 MG PO TABS
50.0000 mg | ORAL_TABLET | Freq: Three times a day (TID) | ORAL | Status: DC
  Administered 2018-03-18 – 2018-03-22 (×11): 50 mg via ORAL
  Filled 2018-03-18 (×13): qty 1

## 2018-03-18 NOTE — NC FL2 (Signed)
[place] LEVEL OF CARE SCREENING TOOL     IDENTIFICATION  Patient Name: Justin Christensen Birthdate: 1931-02-24 Sex: male Admission Date (Current Location): 03/16/2018  [facility] and [place] Number:  Herbalist and Address:  The [place], [place] [address], [place], [place] [address]      Provider Number: [id number]  Attending Physician Name and Address:  Mercy Riding, MD  Relative Name and Phone Number:       Current Level of Care: Hospital Recommended Level of Care: [place] Prior Approval Number:    Date Approved/Denied:   PASRR Number:   [id number]   Discharge Plan: SNF    Current Diagnoses: Patient Active Problem List   Diagnosis Date Noted  . Hypoglycemia 03/17/2018  . CKD (chronic kidney disease), stage V ([place]) 03/17/2018  . HTN (hypertension) 03/17/2018  . HLD (hyperlipidemia) 03/17/2018  . AF (paroxysmal atrial fibrillation) ([place]) 03/17/2018  . COPD (chronic obstructive pulmonary disease) ([place]) 03/17/2018  . CHF (congestive heart failure) ([place]) 03/17/2018  . Chronic anemia 03/17/2018  . Thrombocytopenia ([place]) 03/17/2018  . BPH (benign prostatic hyperplasia) 03/17/2018  . Depression 03/17/2018  . Chronic pain 03/17/2018    Orientation RESPIRATION BLADDER Height & Weight     Self, Time, Place  Normal Incontinent Weight: 82.9 kg Height:  5\' 5"  (165.1 cm)  BEHAVIORAL SYMPTOMS/MOOD NEUROLOGICAL BOWEL NUTRITION STATUS      Incontinent Diet(please see discharge summary. )  AMBULATORY STATUS COMMUNICATION OF NEEDS Skin   Extensive Assist Verbally Normal                       Personal Care Assistance Level of Assistance  Bathing, Feeding, Dressing Bathing Assistance: Maximum assistance Feeding assistance: Limited assistance Dressing Assistance: Maximum assistance     Functional Limitations Info  Sight, Hearing, Speech Sight Info: Adequate Hearing Info: Adequate Speech Info: Adequate     SPECIAL CARE FACTORS FREQUENCY  PT (By licensed PT), OT (By licensed OT)     PT Frequency: 5 times a week OT Frequency: 5 times a week             Contractures Contractures Info: Not present    Additional Factors Info  Code Status, Allergies Code Status Info: Full Code Allergies Info: NKA           Current Medications (03/18/2018):  This is the current hospital active medication list Current Facility-Administered Medications  Medication Dose Route Frequency Provider Last Rate Last Dose  . 0.9 %  sodium chloride infusion  250 mL Intravenous PRN Shela Leff, MD      . acetaminophen (TYLENOL) tablet 325 mg  325 mg Oral Q6H PRN Shela Leff, MD      . albuterol (PROVENTIL) (2.5 MG/3ML) 0.083% nebulizer solution 2.5 mg  2.5 mg Nebulization Q6H PRN Shela Leff, MD      . apixaban Arne [place]) tablet 2.5 mg  2.5 mg Oral BID Shela Leff, MD   2.5 mg at 03/18/18 0953  . Chlorhexidine Gluconate Cloth 2 % PADS 6 each  6 each Topical Q0600 Shela Leff, MD   6 each at 03/18/18 0409  . DULoxetine (CYMBALTA) DR capsule 30 mg  30 mg Oral Daily Shela Leff, MD   30 mg at 03/18/18 0953  . feeding supplement (GLUCERNA SHAKE) (GLUCERNA SHAKE) liquid 237 mL  237 mL Oral TID BM Gonfa, Taye T, MD   237 mL at 03/18/18 1000  . hydrALAZINE (APRESOLINE) tablet  50 mg  50 mg Oral TID Mercy Riding, MD      . HYDROcodone-acetaminophen (NORCO/VICODIN) 5-325 MG per tablet 1 tablet  1 tablet Oral Q8H PRN Shela Leff, MD   1 tablet at 03/18/18 0952  . insulin aspart (novoLOG) injection 0-15 Units  0-15 Units Subcutaneous TID WC Mercy Riding, MD   3 Units at 03/18/18 0732  . insulin aspart (novoLOG) injection 0-5 Units  0-5 Units Subcutaneous QHS Gonfa, Taye T, MD      . MEDLINE mouth rinse  15 mL Mouth Rinse BID Shela Leff, MD   15 mL at 03/18/18 1000  . multivitamin with minerals tablet 1 tablet  1 tablet Oral Daily Shela Leff, MD   1 tablet at  03/18/18 [phone]  . mupirocin ointment (BACTROBAN) 2 % 1 application  1 application Nasal BID Shela Leff, MD   1 application at 82/50/53 [phone]  . simethicone (MYLICON) chewable tablet 80 mg  80 mg Oral Q6H PRN Shela Leff, MD      . simvastatin (ZOCOR) tablet 40 mg  40 mg Oral QHS Shela Leff, MD   40 mg at 03/17/18 2258  . sodium chloride flush (NS) 0.9 % injection 3 mL  3 mL Intravenous Q12H Shela Leff, MD   3 mL at 03/18/18 0955  . sodium chloride flush (NS) 0.9 % injection 3 mL  3 mL Intravenous PRN Shela Leff, MD      . terazosin (HYTRIN) capsule 5 mg  5 mg Oral QHS Shela Leff, MD   5 mg at 03/17/18 2258  . tiotropium (SPIRIVA) inhalation capsule ([facility] use ONLY) 18 mcg  1 capsule Inhalation Daily Shela Leff, MD   18 mcg at 03/18/18 3419     Discharge Medications: Please see discharge summary for a list of discharge medications.  Relevant Imaging Results:  Relevant Lab Results:   Additional Information SSN- [id number]  Wetzel Bjornstad, LCSWA

## 2018-03-18 NOTE — Evaluation (Signed)
Physical Therapy Evaluation Patient Details Name: Justin Christensen MRN: [id number] DOB: Jun 13, 1930 Today's Date: 03/18/2018   History of Present Illness  Pt is an 84 year old man admitted from home with hypoglycemia. PMH: DM, CHF, CKD stage V, HTN, afib, COPD.  Clinical Impression  Pt admitted with above diagnosis. Pt currently with functional limitations due to the deficits listed below (see PT Problem List). Pt with dementia, no family present to determine baseline, following commands 80% of time. Patient requires hand on assistance to sit and stand, fatigues and desats within 7' of walking at this time with close chair follow. Pt would benefit from SNF to safely maximize mobility prior to return home.  Pt will benefit from skilled PT to increase their independence and safety with mobility to allow discharge to the venue listed below.       Follow Up Recommendations SNF    Equipment Recommendations  Rolling walker with 5" wheels    Recommendations for Other Services       Precautions / Restrictions Precautions Precautions: Fall Precaution Comments: watch 02 Restrictions Weight Bearing Restrictions: No      Mobility  Bed Mobility Overal bed mobility: Needs Assistance Bed Mobility: Supine to Sit     Supine to sit: Mod assist     General bed mobility comments: pulled up on therapist's hand, increased time and effort, assist to position hips at EOB  Transfers Overall transfer level: Needs assistance Equipment used: Rolling walker (2 wheeled) Transfers: Sit to/from Stand Sit to Stand: Min assist;+2 safety/equipment;+2 physical assistance         General transfer comment: cues for hand placement, assist to rise and steady, posterior lean upon intially standing, assist to control descent to chair  Ambulation/Gait Ambulation/Gait assistance: Min assist;Min guard Gait Distance (Feet): 10 Feet Assistive device: Rolling walker (2 wheeled) Gait Pattern/deviations: Step-to  pattern Gait velocity: decreased   General Gait Details: desats quickly on RA to 82%, close chair follow, became unsteady quickly  Stairs            Wheelchair Mobility    Modified Rankin (Stroke Patients Only)       Balance Overall balance assessment: Needs assistance   Sitting balance-Leahy Scale: Fair       Standing balance-Leahy Scale: Poor Standing balance comment: dependent on B UE and external support                             Pertinent Vitals/Pain Pain Assessment: Faces Faces Pain Scale: No hurt    Home Living Family/patient expects to be discharged to:: Skilled nursing facility Living Arrangements: Children                    Prior Function           Comments: pt reports independence, no family available to confirm     Hand Dominance   Dominant Hand: Right    Extremity/Trunk Assessment   Upper Extremity Assessment Upper Extremity Assessment: Generalized weakness    Lower Extremity Assessment Lower Extremity Assessment: Generalized weakness    Cervical / Trunk Assessment Cervical / Trunk Assessment: Kyphotic  Communication   Communication: No difficulties  Cognition Arousal/Alertness: Awake/alert Behavior During Therapy: WFL for tasks assessed/performed Overall Cognitive Status: No family/caregiver present to determine baseline cognitive functioning  General Comments      Exercises     Assessment/Plan    PT Assessment Patient needs continued PT services  PT Problem List Decreased strength;Decreased range of motion;Decreased activity tolerance;Decreased balance;Decreased coordination;Decreased mobility       PT Treatment Interventions DME instruction;Gait training;Stair training;Functional mobility training;Therapeutic activities;Therapeutic exercise    PT Goals (Current goals can be found in the Care Plan section)  Acute Rehab PT Goals Patient  Stated Goal: to hunt and fish PT Goal Formulation: With patient Time For Goal Achievement: 04/01/18 Potential to Achieve Goals: Good    Frequency Min 3X/week   Barriers to discharge        Co-evaluation PT/OT/SLP Co-Evaluation/Treatment: Yes Reason for Co-Treatment: For patient/therapist safety PT goals addressed during session: Mobility/safety with mobility;Balance;Proper use of DME OT goals addressed during session: ADL's and self-care       AM-PAC PT "6 Clicks" Daily Activity  Outcome Measure Difficulty turning over in bed (including adjusting bedclothes, sheets and blankets)?: Unable Difficulty moving from lying on back to sitting on the side of the bed? : Unable Difficulty sitting down on and standing up from a chair with arms (e.g., wheelchair, bedside commode, etc,.)?: A Lot Help needed moving to and from a bed to chair (including a wheelchair)?: A Lot Help needed walking in hospital room?: A Lot Help needed climbing 3-5 steps with a railing? : Total 6 Click Score: 9    End of Session Equipment Utilized During Treatment: Gait belt Activity Tolerance: Patient tolerated treatment well Patient left: in bed;with call bell/phone within reach Nurse Communication: Mobility status PT Visit Diagnosis: Unsteadiness on feet (R26.81)    Time: 8325-4982 PT Time Calculation (min) (ACUTE ONLY): 33 min   Charges:   PT Evaluation $PT Eval Moderate Complexity: 1 Mod          Reinaldo Berber, PT, DPT Acute Rehabilitation Services Pager: [phone] Office: [phone]    Reinaldo Berber 03/18/2018, 5:42 PM

## 2018-03-18 NOTE — Progress Notes (Addendum)
10:58am-CSW spoke with pt at bedside and pt is agreeable to SNF placement at the time of discharge. CSW informed pt that at this time PT would need to work with pt to see pt's needs are. Pt expressed understanding and a desire to get out of the hospital. CSW has faxed pt out to [facility] and other facilities at this time. Pt's top choice is [facility].  CSW reached out to this facility and left voicemail for admission to call CSW back about pt.  CSW spoke with pt's daughter [name] and niece Ebony Hail. Both parties expressed that they are now interested in [facility] as the facility of choice. CSW explained to both that CSW is still waiting to see what PT suggest and then CSW can go from there with looking for placement for pt.     CSW spoke with Ralene Bathe [phone] extension [phone] and was made aware that pt has short term placement benefits with the [facility] however they do not cover the [facility] facility. [facility] covers [place], and [employer] in [place]. CSW informed that if pt wants a facility outside of what the [place] offers then pt would have to pay for that at own expense.CSW explained this to family at this time and they expressed that they would use pt's Medicare Part A for placement and then applying for Medicare Part B in January. At this time CSW will continue to follow for further needs.    Virgie Dad Margie Urbanowicz, MSW, [facility] Clinical Social Worker [phone]

## 2018-03-18 NOTE — Progress Notes (Signed)
PROGRESS NOTE  Justin Christensen BJY:[id number] DOB: [birth year] DOA: 03/16/2018 PCP: [name]   LOS: 0 days   Brief Narrative / Interim history: 84 y.o. male with medical history significant of type 2 diabetes, CHF, CKD stage V, hypertension, paroxysmal A. fib, COPD admitted due to hypoglycemia likely due to accidental overdose with glipizide.  Unresponsive and hypoglycemic stool 36 when EMS arrived at his home.  Needed dextrose infusion and pushes and 2 doses of octreotide.  CBG finally stabilized in 200s.  Started on sliding scale.  Daughter reported inability to care for patient and requested evaluation for SNF placement.  Patient is wheelchair-bound at baseline.  Subjective: No major events overnight.  No complaints this morning.  Denies headache, vision change, chest pain or dyspnea.   Assessment & Plan: Principal Problem:   Hypoglycemia Active Problems:   CKD (chronic kidney disease), stage V (HCC)   HTN (hypertension)   HLD (hyperlipidemia)   AF (paroxysmal atrial fibrillation) (HCC)   COPD (chronic obstructive pulmonary disease) (HCC)   CHF (congestive heart failure) (HCC)   Chronic anemia   Thrombocytopenia (HCC)   BPH (benign prostatic hyperplasia)   Depression   Chronic pain Hypoglycemia: Likely due to accidental overdose with glipizide.  Stabilized after dextrose infusion and pushes octreotide 2 doses.  Started sliding scale insulin last night.  A1c 7.2% which appears to be a good goal for this patient. -Continue SSI -May consider Januvia or other meds with low risk of hypoglycemia on discharge -PT OT eval for placement.  CKD 4: Serum creatinine 4.5.  No baseline in chart. -Repeat BMP -May need outpatient nephrology in the setting of anemia.  Hypertension: Stable -Continue home meds  Paroxysmal A. Fib: Not in RVR -Continue home Eliquis -Not on rate or rhythm control.  History of significant bradycardia with atenolol   COPD: Stable. -Continue home  meds  Diastolic CHF : Stable.  No signs of fluid overload.  No cardiopulmonary symptoms. -Continue monitoring  Normocytic anemia/anemia of chronic disease -Recheck CBC  Thrombocytopenia -Recheck CBC  BPH -Continue home terazosin  Other chronic medical conditions stable.  Continue home meds.  Scheduled Meds: . apixaban  2.5 mg Oral BID  . Chlorhexidine Gluconate Cloth  6 each Topical Q0600  . DULoxetine  30 mg Oral Daily  . feeding supplement (GLUCERNA SHAKE)  237 mL Oral TID BM  . hydrALAZINE  50 mg Oral TID  . insulin aspart  0-15 Units Subcutaneous TID WC  . insulin aspart  0-5 Units Subcutaneous QHS  . mouth rinse  15 mL Mouth Rinse BID  . multivitamin with minerals  1 tablet Oral Daily  . mupirocin ointment  1 application Nasal BID  . simvastatin  40 mg Oral QHS  . sodium chloride flush  3 mL Intravenous Q12H  . terazosin  5 mg Oral QHS  . tiotropium  1 capsule Inhalation Daily   Continuous Infusions: . sodium chloride     PRN Meds:.sodium chloride, acetaminophen, albuterol, HYDROcodone-acetaminophen, simethicone, sodium chloride flush  DVT prophylaxis: On Eliquis Code Status: Full code Family Communication: No family member at bedside. Disposition Plan: Transfer to [place]  Consultants:   None  Procedures:   None  Antimicrobials:  None  Objective: Vitals:   03/18/18 1002 03/18/18 1100 03/18/18 1127 03/18/18 1202  BP: (!) 162/71 (!) 172/65  (!) 178/78  Pulse: 90 81  79  Resp: 20 (!) 23  (!) 21  Temp:   98.6 F (37 C)   TempSrc:  Oral   SpO2: (!) 87% 93%  93%  Weight:      Height:        Intake/Output Summary (Last 24 hours) at 03/18/2018 1313 Last data filed at 03/18/2018 1158 Gross per 24 hour  Intake 137.21 ml  Output 1175 ml  Net -1037.79 ml   Filed Weights   03/17/18 0900  Weight: 82.9 kg    Examination:  GENERAL: Sitting in bed getting ready breakfast.  Appears well, no ditress HEENT: PERRLA, MMM NECK: Supple LUNGS:  No  IWOB, good air movement, CTAB HEART:  RRR with no M/R/G ABD:  Morbidly obese, soft, NT with active BS EXT: Bilateral edema to his ankles bilaterally NEURO:  awake, alert and oriented appropriately. No gross deficit PSYCH: calm. Normal affect   Data Reviewed: I have independently reviewed following labs and imaging studies.  CBC: Recent Labs  Lab 03/16/18 2115  WBC 6.8  HGB 9.5*  HCT 32.0*  MCV 109.6*  PLT 734*   Basic Metabolic Panel: Recent Labs  Lab 03/16/18 2115  NA 138  K 4.0  CL 103  CO2 28  GLUCOSE 87  BUN 67*  CREATININE 4.55*  CALCIUM 8.6*   GFR: Estimated Creatinine Clearance: 11.8 mL/min (A) (by C-G formula based on SCr of 4.55 mg/dL (H)). Liver Function Tests: Recent Labs  Lab 03/16/18 2115  AST 38  ALT 22  ALKPHOS 90  BILITOT 0.6  PROT 6.1*  ALBUMIN 3.1*   No results for input(s): LIPASE, AMYLASE in the last 168 hours. No results for input(s): AMMONIA in the last 168 hours. Coagulation Profile: No results for input(s): INR, PROTIME in the last 168 hours. Cardiac Enzymes: No results for input(s): CKTOTAL, CKMB, CKMBINDEX, TROPONINI in the last 168 hours. BNP (last 3 results) No results for input(s): PROBNP in the last 8760 hours. HbA1C: Recent Labs    03/17/18 1913  HGBA1C 7.2*   CBG: Recent Labs  Lab 03/17/18 1535 03/17/18 1839 03/17/18 2228 03/18/18 0716 03/18/18 1122  GLUCAP 251* 237* 202* 154* 191*   Lipid Profile: No results for input(s): CHOL, HDL, LDLCALC, TRIG, CHOLHDL, LDLDIRECT in the last 72 hours. Thyroid Function Tests: No results for input(s): TSH, T4TOTAL, FREET4, T3FREE, THYROIDAB in the last 72 hours. Anemia Panel: No results for input(s): VITAMINB12, FOLATE, FERRITIN, TIBC, IRON, RETICCTPCT in the last 72 hours. Urine analysis: No results found for: COLORURINE, APPEARANCEUR, LABSPEC, PHURINE, GLUCOSEU, HGBUR, BILIRUBINUR, KETONESUR, PROTEINUR, UROBILINOGEN, NITRITE, LEUKOCYTESUR Sepsis Labs: Invalid input(s):  PROCALCITONIN, LACTICIDVEN  Recent Results (from the past 240 hour(s))  MRSA PCR Screening     Status: Abnormal   Collection Time: 03/17/18  9:13 AM  Result Value Ref Range Status   MRSA by PCR POSITIVE (A) NEGATIVE Final    Comment:        The GeneXpert MRSA Assay (FDA approved for NASAL specimens only), is one component of a comprehensive MRSA colonization surveillance program. It is not intended to diagnose MRSA infection nor to guide or monitor treatment for MRSA infections. RESULT CALLED TO, READ BACK BY AND VERIFIED WITH: S. Ward RN 11:30 03/17/18 ([email or web address]) Performed at [facility], [place] [address], [place], [place] [address]       Radiology Studies: No results found.  Taye T. [facility] Pager [phone]  If 7PM-7AM, please contact night-coverage [email or web address] Password [email or web address] 03/18/2018, 1:13 PM

## 2018-03-18 NOTE — Progress Notes (Signed)
Inpatient Diabetes Program Recommendations  AACE/ADA: New Consensus Statement on Inpatient Glycemic Control (2015)  Target Ranges:  Prepandial:   less than 140 mg/dL      Peak postprandial:   less than 180 mg/dL (1-2 hours)      Critically ill patients:  140 - 180 mg/dL   Lab Results  Component Value Date   GLUCAP 154 (H) 03/18/2018   HGBA1C 7.2 (H) 03/17/2018     Results for Justin Christensen, Justin Christensen (MRN [id number]) as of 03/18/2018 14:41  Ref. Range 03/17/2018 12:24 03/17/2018 14:22 03/17/2018 15:35 03/17/2018 18:39 03/17/2018 22:28 03/18/2018 07:16 03/18/2018 11:22  Glucose-Capillary Latest Ref Range: 70 - 99 mg/dL 209 (H) 246 (H) 251 (H) 237 (H) 202 (H) 154 (H) 191 (H)     Diabetes history: DM2  Home DM meds:  Glipizide 10 mg BID (had been taking at a higher dose than ordered)  Current DM meds: Novolog sensitive (0-9 units) scale tid and (0-5units) hs   Note patient admitted for hypoglycemia. Per EMS patient unresponsive and CBG 36 (D50 given). CBG 60 upon arrival to ER and 1 amp of D50 given several times in ER due to continued hypoglycemia. Last time D50 given was 0758 10/9. Now patient eating 25% of meals and drinking Glucerna shakes.   Patient had been on Metformin and Glipizide prior to a hospitalization at [facility] in late September. D/c home only on Glipizide 10 mg BID. Per notes, patient had been taking at a much higher dose - 3 tabs in am and 2 tabs in afternoon.   Agree with MD of stopping Glipizide due to hypoglycemic episodes and starting a DPP-4 inhibitor. CKD stage 5. Instead of ordering oral med Januvia which is eliminated via the kidney recommend comparable med such as Tradjenta (also DPP-4)  which is eliminated via feces.   A1c is 7.2% this admission though doubt accuracy due to low hemoglobin.   -- Will follow during hospitalization.--  Jonna Clark RN, MSN Diabetes Coordinator Inpatient Glycemic Control Team Team Pager: [phone] (8am-5pm)

## 2018-03-18 NOTE — Evaluation (Signed)
Occupational Therapy Evaluation Patient Details Name: Justin Christensen MRN: [id number] DOB: [birth year] Today's Date: 03/18/2018    History of Present Illness Pt is an 84 year old man admitted from home with hypoglycemia. PMH: DM, CHF, CKD stage V, HTN, afib, COPD.   Clinical Impression   Pt immediately agreeable to get OOB and work with therapies. No family available to confirm, but pt reports independence in self care. Pt presents with impaired cognition, decreased activity tolerance and poor standing balance. His Sp02 dropped to 82% on RA with short distance ambulation with RW, rebounded quickly with return of 2L to mid 90s. Pt also in afib. Pt will need rehab upon discharge in SNF. Will follow acutely.    Follow Up Recommendations  SNF;Supervision/Assistance - 24 hour    Equipment Recommendations       Recommendations for Other Services       Precautions / Restrictions Precautions Precautions: Fall Precaution Comments: watch 02 Restrictions Weight Bearing Restrictions: No      Mobility Bed Mobility Overal bed mobility: Needs Assistance Bed Mobility: Supine to Sit     Supine to sit: Mod assist     General bed mobility comments: pulled up on therapist's hand, increased time and effort, assist to position hips at EOB  Transfers Overall transfer level: Needs assistance Equipment used: Rolling walker (2 wheeled) Transfers: Sit to/from Stand Sit to Stand: Min assist;+2 safety/equipment;+2 physical assistance         General transfer comment: cues for hand placement, assist to rise and steady, posterior lean upon intially standing, assist to control descent to chair    Balance Overall balance assessment: Needs assistance   Sitting balance-Leahy Scale: Fair       Standing balance-Leahy Scale: Poor Standing balance comment: dependent on B UE and external support                           ADL either performed or assessed with clinical judgement   ADL  Overall ADL's : Needs assistance/impaired Eating/Feeding: Set up;Sitting   Grooming: Set up;Sitting   Upper Body Bathing: Minimal assistance;Sitting   Lower Body Bathing: Maximal assistance;Sit to/from stand   Upper Body Dressing : Minimal assistance;Sitting   Lower Body Dressing: Maximal assistance;Sit to/from stand   Toilet Transfer: Minimal assistance;Ambulation;RW;BSC;+2 for safety/equipment   Toileting- Clothing Manipulation and Hygiene: Maximal assistance;Sit to/from stand       Functional mobility during ADLs: Minimal assistance;Moderate assistance;Rolling walker;+2 for safety/equipment       Vision Patient Visual Report: No change from baseline       Perception     Praxis      Pertinent Vitals/Pain Pain Assessment: Faces Faces Pain Scale: No hurt     Hand Dominance Right   Extremity/Trunk Assessment Upper Extremity Assessment Upper Extremity Assessment: Generalized weakness   Lower Extremity Assessment Lower Extremity Assessment: Defer to PT evaluation   Cervical / Trunk Assessment Cervical / Trunk Assessment: Kyphotic   Communication Communication Communication: No difficulties   Cognition Arousal/Alertness: Awake/alert Behavior During Therapy: WFL for tasks assessed/performed Overall Cognitive Status: No family/caregiver present to determine baseline cognitive functioning                                     General Comments       Exercises     Shoulder Instructions      Home Living Family/patient  expects to be discharged to:: Skilled nursing facility Living Arrangements: Children(daughter and 2 adult grandsons)                                      Prior Functioning/Environment          Comments: pt reports independence, no family available to confirm        OT Problem List: Decreased strength;Decreased activity tolerance;Impaired balance (sitting and/or standing);Decreased cognition;Decreased  safety awareness;Decreased knowledge of use of DME or AE;Cardiopulmonary status limiting activity      OT Treatment/Interventions: Self-care/ADL training;DME and/or AE instruction;Energy conservation;Patient/family education;Balance training;Therapeutic activities;Cognitive remediation/compensation    OT Goals(Current goals can be found in the care plan section) Acute Rehab OT Goals Patient Stated Goal: to hunt and fish OT Goal Formulation: With patient Time For Goal Achievement: 04/01/18 Potential to Achieve Goals: Good ADL Goals Pt Will Perform Grooming: standing;with min assist Pt Will Perform Upper Body Dressing: with supervision;with set-up;sitting Pt Will Perform Lower Body Dressing: with min assist;sit to/from stand Pt Will Transfer to Toilet: with min guard assist;ambulating;bedside commode Pt Will Perform Toileting - Clothing Manipulation and hygiene: with min assist;sit to/from stand Additional ADL Goal #1: Pt will initiate rest breaks with min verbal cues.  OT Frequency: Min 2X/week   Barriers to D/C: Decreased caregiver support          Co-evaluation PT/OT/SLP Co-Evaluation/Treatment: Yes Reason for Co-Treatment: For patient/therapist safety   OT goals addressed during session: ADL's and self-care      AM-PAC PT "6 Clicks" Daily Activity     Outcome Measure Help from another person eating meals?: None Help from another person taking care of personal grooming?: A Little Help from another person toileting, which includes using toliet, bedpan, or urinal?: A Lot Help from another person bathing (including washing, rinsing, drying)?: A Lot Help from another person to put on and taking off regular upper body clothing?: A Little Help from another person to put on and taking off regular lower body clothing?: A Lot 6 Click Score: 16   End of Session Equipment Utilized During Treatment: Gait belt;Rolling walker Nurse Communication: Mobility status;Other (comment)(aware of  BP)  Activity Tolerance: Treatment limited secondary to medical complications (XBWIOMB)(55 sats droppin) Patient left: in chair;with call bell/phone within reach;with chair alarm set  OT Visit Diagnosis: Unsteadiness on feet (R26.81);Other abnormalities of gait and mobility (R26.89);Muscle weakness (generalized) (M62.81);Cognitive communication deficit (R41.841)                Time: 9741-6384 OT Time Calculation (min): 22 min Charges:  OT General Charges $OT Visit: 1 Visit OT Evaluation $OT Eval Moderate Complexity: 1 Mod  Nestor Lewandowsky, OTR/L Acute Rehabilitation Services Pager: [phone] Office: [phone]  Malka So 03/18/2018, 5:22 PM

## 2018-03-18 NOTE — Clinical Social Work Note (Signed)
Clinical Social Work Assessment  Patient Details  Name: Justin Christensen MRN: [id number] Date of Birth: September 10, 1930  Date of referral:  03/18/18               Reason for consult:  Facility Placement, Discharge Planning                Permission sought to share information with:  Family Supports Permission granted to share information::  Yes, Release of Information Signed  Name::     Justin Christensen  Agency::  family  Relationship::  daughter  Contact Information:  Justin Christensen [phone]  Housing/Transportation Living arrangements for the past 2 months:  Single Family Home(with daughter and grandson) Source of Information:  Adult Children Patient Interpreter Needed:  None Criminal Activity/Legal Involvement Pertinent to Current Situation/Hospitalization:  No - Comment as needed Significant Relationships:  Adult Children, Other Family Members Lives with:  Adult Children Do you feel safe going back to the place where you live?  Yes(after rehab per daughter) Need for family participation in patient care:  Yes (Comment)  Care giving concerns:  CSW consulted by RN for possible SNF placement at the time of discharge.    Social Worker assessment / plan:  CSW asked to speak with pt's daughter for this assessment. CSW spoke with daughter and was informed that pt is from home with daughter Justin Christensen and grandson. Per Justin Christensen she and son have been taking care of pt for the past three weeks as pt has been declining since then. Daughter suspects that pt declines is due to medication that pt has been taking. Daughter expressed to CSW that pt is usually up and independent with little to no assistance. Daughter reports that she would love to have pt back home however she feels that pt needs rehab before returning home. Daughter interested in [place] as this facility is closer to them. CSW expressed that CSW would see what PT recommends for pt and follow from there. Daughter understanding and agreeable at this  time.   Employment status:  Retired Forensic scientist:  Other (Comment Required)(Medicare Part A only) PT Recommendations:  Not assessed at this time Information / Referral to community resources:  Skilled Nursing Facility(spoke with daughter about possible placement options. )  Patient/Family's Response to care:  Pt's daughter expressed understanding and agreeable to plan of care at this time.  Patient/Family's Understanding of and Emotional Response to Diagnosis, Current Treatment, and Prognosis:  No further questions or concerns have been presemnted to CSW at this time.   Emotional Assessment Appearance:  Appears stated age Attitude/Demeanor/Rapport:  Engaged Affect (typically observed):  Pleasant Orientation:  Oriented to Self, Oriented to Place, Oriented to  Time Alcohol / Substance use:  Not Applicable Psych involvement (Current and /or in the community):  No (Comment)  Discharge Needs  Concerns to be addressed:  Care Coordination Readmission within the last 30 days:  No Current discharge risk:  Dependent with Mobility Barriers to Discharge:  Continued Medical Work up   [employer], [place] 03/18/2018, 8:38 AM

## 2018-03-19 DIAGNOSIS — I10 Essential (primary) hypertension: Secondary | ICD-10-CM

## 2018-03-19 LAB — GLUCOSE, CAPILLARY
GLUCOSE-CAPILLARY: 75 mg/dL (ref 70–99)
GLUCOSE-CAPILLARY: 146 mg/dL — AB (ref 70–99)
GLUCOSE-CAPILLARY: 128 mg/dL — AB (ref 70–99)
Glucose-Capillary: 95 mg/dL (ref 70–99)
Glucose-Capillary: 128 mg/dL — ABNORMAL HIGH (ref 70–99)

## 2018-03-19 LAB — CBC
HCT: 26.8 % — ABNORMAL LOW (ref 39.0–52.0)
Hemoglobin: 8.5 g/dL — ABNORMAL LOW (ref 13.0–17.0)
MCH: 33.5 pg (ref 26.0–34.0)
MCHC: 31.7 g/dL (ref 30.0–36.0)
MCV: 105.5 fL — AB (ref 80.0–100.0)
NRBC: 0 % (ref 0.0–0.2)
PLATELETS: 124 10*3/uL — AB (ref 150–400)
RBC: 2.54 MIL/uL — AB (ref 4.22–5.81)
RDW: 13 % (ref 11.5–15.5)
WBC: 7.4 10*3/uL (ref 4.0–10.5)

## 2018-03-19 MED ORDER — FUROSEMIDE 80 MG PO TABS
80.0000 mg | ORAL_TABLET | Freq: Two times a day (BID) | ORAL | Status: DC
  Administered 2018-03-19 – 2018-03-22 (×7): 80 mg via ORAL
  Filled 2018-03-19 (×7): qty 1

## 2018-03-19 NOTE — Progress Notes (Signed)
CSW spoke with Threasa Beards from [facility] and was informed that they are able to take pt once medically stable. CSW left voicemail for daughter and niece asking that they call CSW back for updates. CSW has sent over PT notes to facility at this time. CSw will continue to follow for further needs.    Virgie Dad Zakia Sainato, MSW, [facility] Clinical Social Worker [phone]

## 2018-03-19 NOTE — Progress Notes (Signed)
[facility]  Patient Status: Active (receiving services up to time of hospitalization)  [facility] is providing the following services: RN, PT, OT and HHA  If patient discharges after hours, please call [phone].   Justin Christensen 03/19/2018, 3:46 PM

## 2018-03-19 NOTE — Progress Notes (Addendum)
PROGRESS NOTE        PATIENT DETAILS Name: Justin Christensen Age: 84 y.o. Sex: male Date of Birth: [birth year] Admit Date: 03/16/2018 Admitting Physician Mercy Riding, MD [id number], [name]  Brief Narrative: Patient is a 84 y.o. male with history of DM-2, stage V CKD, PAF on anticoagulation, COPD admitted with hypoglycemia secondary to accidental ingestion of extra tablets of glipizide, patient was monitored in the stepdown unit, provided supportive care with IV dextrose, octreotide.  Evaluated by physical therapy with recommendations to transfer to SNF.  See below for further details.  Subjective: Awake and alert-denies any chest pain or shortness of breath.  Assessment/Plan: Hypoglycemia: Secondary to accidental ingestion of extra tablets of glipizide in the setting of advanced CKD.  Hypoglycemia has resolved with supportive care.  DM-2: CBG stable-A1c 7.2-given advanced age and multiple medical issues-candidate A1c increased to 8.  Would avoid aggressive control of his sugars.  Continue SSI.  CKD stage IV-V: Good urine output-electrolytes stable-follow with nephrology in the outpatient setting.  Lower extremity edema-we will go ahead and start Lasix.  Chronic diastolic heart failure/severe pulmonary hypertension (TTE on 03/05/2018 in care everywhere-showed EF 55-60%, severe pulmonary hypertension with RSVP of more than 60 mmHg): Has 2+ pitting edema in the lower extremities-but is otherwise compensated-per patient edema is chronic-we will go to and start Lasix.  Follow.  Chronic A. fib: Rate controlled--continue Eliquis.  Hypertension: Moderate controlled-adding Lasix-continue hydralazine  Dyslipidemia: Continue with statin  COPD: Continue bronchodilators-stable-no exacerbation.  Depression: Stable-continue Cymbalta  DVT Prophylaxis: Eliquis  Code Status: Full code  Family Communication: None at bedside  Disposition Plan: Remain  inpatient-SNF on d/c  Antimicrobial agents: Anti-infectives (From admission, onward)   None     Procedures: None  CONSULTS:  None  Time spent: 25- minutes-Greater than 50% of this time was spent in counseling, explanation of diagnosis, planning of further management, and coordination of care.  MEDICATIONS: Scheduled Meds: . apixaban  2.5 mg Oral BID  . Chlorhexidine Gluconate Cloth  6 each Topical Q0600  . DULoxetine  30 mg Oral Daily  . feeding supplement (GLUCERNA SHAKE)  237 mL Oral TID BM  . furosemide  80 mg Oral BID  . hydrALAZINE  50 mg Oral TID  . insulin aspart  0-15 Units Subcutaneous TID WC  . insulin aspart  0-5 Units Subcutaneous QHS  . mouth rinse  15 mL Mouth Rinse BID  . multivitamin with minerals  1 tablet Oral Daily  . mupirocin ointment  1 application Nasal BID  . simvastatin  40 mg Oral QHS  . sodium chloride flush  3 mL Intravenous Q12H  . terazosin  5 mg Oral QHS  . tiotropium  1 capsule Inhalation Daily   Continuous Infusions: . sodium chloride     PRN Meds:.sodium chloride, acetaminophen, albuterol, HYDROcodone-acetaminophen, metoprolol tartrate, simethicone, sodium chloride flush   PHYSICAL EXAM: Vital signs: Vitals:   03/18/18 2337 03/19/18 0615 03/19/18 0809 03/19/18 0852  BP: (!) 178/65 (!) 171/87    Pulse:  81    Resp:  19    Temp:  98.5 F (36.9 C)    TempSrc:  Oral    SpO2:  98% 93%   Weight:    80.4 kg  Height:       Filed Weights   03/17/18 0900 03/19/18 0852  Weight: 82.9 kg 80.4  kg   Body mass index is 29.5 kg/m.   General appearance :Awake, alert, not in any distress. Eyes:.Pink conjunctiva HEENT: Atraumatic and Normocephalic Neck: supple Resp:Good air entry bilaterally, no added sounds  CVS: S1 S2 regular, no murmurs.  GI: Bowel sounds present, Non tender and not distended with no gaurding, rigidity or rebound. Extremities: B/L Lower Ext shows no edema, both legs are warm to touch Neurology:  speech clear,non  focal Musculoskeletal:No digital cyanosis Skin:No Rash, warm and dry Wounds:N/A  I have personally reviewed following labs and imaging studies  LABORATORY DATA: CBC: Recent Labs  Lab 03/16/18 2115 03/18/18 1319 03/19/18 0352  WBC 6.8 6.7 7.4  HGB 9.5* 8.4* 8.5*  HCT 32.0* 28.4* 26.8*  MCV 109.6* 108.4* 105.5*  PLT 121* 122* 124*    Basic Metabolic Panel: Recent Labs  Lab 03/16/18 2115 03/18/18 1319  NA 138 137  K 4.0 4.2  CL 103 100  CO2 28 26  GLUCOSE 87 158*  BUN 67* 62*  CREATININE 4.55* 4.27*  CALCIUM 8.6* 8.7*    GFR: Estimated Creatinine Clearance: 12.4 mL/min (A) (by C-G formula based on SCr of 4.27 mg/dL (H)).  Liver Function Tests: Recent Labs  Lab 03/16/18 2115  AST 38  ALT 22  ALKPHOS 90  BILITOT 0.6  PROT 6.1*  ALBUMIN 3.1*   No results for input(s): LIPASE, AMYLASE in the last 168 hours. No results for input(s): AMMONIA in the last 168 hours.  Coagulation Profile: No results for input(s): INR, PROTIME in the last 168 hours.  Cardiac Enzymes: No results for input(s): CKTOTAL, CKMB, CKMBINDEX, TROPONINI in the last 168 hours.  BNP (last 3 results) No results for input(s): PROBNP in the last 8760 hours.  HbA1C: Recent Labs    03/17/18 1913  HGBA1C 7.2*    CBG: Recent Labs  Lab 03/18/18 1122 03/18/18 1537 03/18/18 2124 03/19/18 0624 03/19/18 0902  GLUCAP 191* 135* 120* 75 95    Lipid Profile: No results for input(s): CHOL, HDL, LDLCALC, TRIG, CHOLHDL, LDLDIRECT in the last 72 hours.  Thyroid Function Tests: No results for input(s): TSH, T4TOTAL, FREET4, T3FREE, THYROIDAB in the last 72 hours.  Anemia Panel: No results for input(s): VITAMINB12, FOLATE, FERRITIN, TIBC, IRON, RETICCTPCT in the last 72 hours.  Urine analysis: No results found for: COLORURINE, APPEARANCEUR, LABSPEC, PHURINE, GLUCOSEU, HGBUR, BILIRUBINUR, KETONESUR, PROTEINUR, UROBILINOGEN, NITRITE, LEUKOCYTESUR  Sepsis Labs: Lactic Acid, Venous No  results found for: LATICACIDVEN  MICROBIOLOGY: Recent Results (from the past 240 hour(s))  MRSA PCR Screening     Status: Abnormal   Collection Time: 03/17/18  9:13 AM  Result Value Ref Range Status   MRSA by PCR POSITIVE (A) NEGATIVE Final    Comment:        The GeneXpert MRSA Assay (FDA approved for NASAL specimens only), is one component of a comprehensive MRSA colonization surveillance program. It is not intended to diagnose MRSA infection nor to guide or monitor treatment for MRSA infections. RESULT CALLED TO, READ BACK BY AND VERIFIED WITH: S. Ward RN 11:30 03/17/18 ([email or web address]) Performed at [facility], [place] [address] [place], [place] [address]     RADIOLOGY STUDIES/RESULTS: No results found.   LOS: 1 day   Oren Binet, MD  [facility]  If 7PM-7AM, please contact night-coverage  Please page via [email or web address]-Password [email or web address]-click on MD name and type text message  03/19/2018, 10:57 AM

## 2018-03-20 LAB — BASIC METABOLIC PANEL
ANION GAP: 11 (ref 5–15)
BUN: 62 mg/dL — ABNORMAL HIGH (ref 8–23)
CHLORIDE: 101 mmol/L (ref 98–111)
CO2: 27 mmol/L (ref 22–32)
Calcium: 9 mg/dL (ref 8.9–10.3)
Creatinine, Ser: 3.95 mg/dL — ABNORMAL HIGH (ref 0.61–1.24)
GFR calc Af Amer: 15 mL/min — ABNORMAL LOW (ref >60)
GFR calc non Af Amer: 13 mL/min — ABNORMAL LOW (ref >60)
GLUCOSE: 161 mg/dL — AB (ref 70–99)
POTASSIUM: 4.1 mmol/L (ref 3.5–5.1)
Sodium: 139 mmol/L (ref 135–145)

## 2018-03-20 LAB — GLUCOSE, CAPILLARY
GLUCOSE-CAPILLARY: 183 mg/dL — AB (ref 70–99)
GLUCOSE-CAPILLARY: 121 mg/dL — AB (ref 70–99)
GLUCOSE-CAPILLARY: 212 mg/dL — AB (ref 70–99)
Glucose-Capillary: 166 mg/dL — ABNORMAL HIGH (ref 70–99)

## 2018-03-20 MED ORDER — INSULIN ASPART 100 UNIT/ML ~~LOC~~ SOLN: SUBCUTANEOUS | 11 refills | Status: DC

## 2018-03-20 MED ORDER — HYDROCODONE-ACETAMINOPHEN 5-325 MG PO TABS: 1.0000 | ORAL_TABLET | Freq: Three times a day (TID) | ORAL | 0 refills | Status: DC | PRN

## 2018-03-20 MED ORDER — HYDRALAZINE HCL 25 MG PO TABS: 50.0000 mg | ORAL_TABLET | Freq: Three times a day (TID) | ORAL | Status: AC

## 2018-03-20 MED ORDER — FUROSEMIDE 80 MG PO TABS: 80.0000 mg | ORAL_TABLET | Freq: Two times a day (BID) | ORAL | Status: DC

## 2018-03-20 MED ORDER — AMLODIPINE BESYLATE 5 MG PO TABS: 5.0000 mg | ORAL_TABLET | Freq: Every day | ORAL | Status: AC

## 2018-03-20 MED ORDER — GLUCERNA SHAKE PO LIQD: 237.0000 mL | Freq: Three times a day (TID) | ORAL | 0 refills | Status: AC

## 2018-03-20 MED ORDER — AMLODIPINE BESYLATE 5 MG PO TABS
5.0000 mg | ORAL_TABLET | Freq: Every day | ORAL | Status: DC
  Administered 2018-03-20 – 2018-03-22 (×3): 5 mg via ORAL
  Filled 2018-03-20 (×3): qty 1

## 2018-03-20 NOTE — Progress Notes (Signed)
PROGRESS NOTE        PATIENT DETAILS Name: Justin Christensen Colclasure Age: 84 y.o. Sex: male Date of Birth: 09/15/1930 Admit Date: 03/16/2018 Admitting Physician Mercy Riding, MD [id number], [name]  Brief Narrative: Patient is a 84 y.o. male with history of DM-2, stage V CKD, PAF on anticoagulation, COPD admitted with hypoglycemia secondary to accidental ingestion of extra tablets of glipizide, patient was monitored in the stepdown unit, provided supportive care with IV dextrose, octreotide.  Evaluated by physical therapy with recommendations to transfer to SNF.  See below for further details.  Subjective: Awake and alert.  No major issues overnight.  Assessment/Plan: Hypoglycemia: Secondary to accidental ingestion of extra tablets of glipizide in the setting of advanced CKD.  Hypoglycemia has resolved with supportive care.  DM-2: CBG stable-A1c 7.2-given advanced age and multiple medical issues-candidate A1c increased to 8.  Would avoid aggressive control of his sugars.  Continue SSI on discharge.  CKD stage IV-V: Creatinine improved-close to usual baseline.  On Lasix with improvement in lower extremity edema.  Continue to follow weights/electrolytes in the outpatient setting.    Chronic diastolic heart failure/severe pulmonary hypertension (TTE on 03/05/2018 in care everywhere-showed EF 55-60%, severe pulmonary hypertension with RSVP of more than 60 mmHg): Is improving edema in the lower extremities with reinitiation of Lasix.  Continue to follow closely in the outpatient setting.  Repeat electrolytes periodically.    Chronic A. fib: Rate controlled--continue Eliquis.  Hypertension: Moderate controlled-continue Lasix and hydralazine-add amlodipine-follow and adjust further in the outpatient setting.   Dyslipidemia: Continue with statin  COPD: Continue bronchodilators-stable-no exacerbation.  Depression: Stable-continue Cymbalta  DVT  Prophylaxis: Eliquis  Code Status: Full code  Family Communication: None at bedside  Disposition Plan: Remain inpatient-SNF on d/c on 10/13  Antimicrobial agents: Anti-infectives (From admission, onward)   None     Procedures: None  CONSULTS:  None  Time spent: 25- minutes-Greater than 50% of this time was spent in counseling, explanation of diagnosis, planning of further management, and coordination of care.  MEDICATIONS: Scheduled Meds: . apixaban  2.5 mg Oral BID  . Chlorhexidine Gluconate Cloth  6 each Topical Q0600  . DULoxetine  30 mg Oral Daily  . feeding supplement (GLUCERNA SHAKE)  237 mL Oral TID BM  . furosemide  80 mg Oral BID  . hydrALAZINE  50 mg Oral TID  . insulin aspart  0-15 Units Subcutaneous TID WC  . insulin aspart  0-5 Units Subcutaneous QHS  . mouth rinse  15 mL Mouth Rinse BID  . multivitamin with minerals  1 tablet Oral Daily  . mupirocin ointment  1 application Nasal BID  . simvastatin  40 mg Oral QHS  . sodium chloride flush  3 mL Intravenous Q12H  . terazosin  5 mg Oral QHS  . tiotropium  1 capsule Inhalation Daily   Continuous Infusions: . sodium chloride     PRN Meds:.sodium chloride, acetaminophen, albuterol, HYDROcodone-acetaminophen, metoprolol tartrate, simethicone, sodium chloride flush   PHYSICAL EXAM: Vital signs: Vitals:   03/20/18 0500 03/20/18 0553 03/20/18 0927 03/20/18 1155  BP:  (!) 173/94  (!) 165/88  Pulse:  83 92 88  Resp:   18 18  Temp:  (!) 97.4 F (36.3 C)  98.9 F (37.2 C)  TempSrc:    Oral  SpO2:  (!) 89% (!) 89% 92%  Weight:  77.3 kg     Height:       Filed Weights   03/17/18 0900 03/19/18 0852 03/20/18 0500  Weight: 82.9 kg 80.4 kg 77.3 kg   Body mass index is 28.36 kg/m.  General appearance:Awake, alert, not in any distress.  Eyes:no scleral icterus. HEENT: Atraumatic and Normocephalic Neck: supple, no JVD. Resp:Good air entry bilaterally,no rales or rhonchi CVS: S1 S2 regular, no murmurs.   GI: Bowel sounds present, Non tender and not distended with no gaurding, rigidity or rebound. Extremities: B/L Lower Ext shows 1+ edema, both legs are warm to touch Neurology:  Non focal Psychiatric: Normal judgment and insight. Normal mood. Musculoskeletal:No digital cyanosis Skin:No Rash, warm and dry Wounds:N/A  I have personally reviewed following labs and imaging studies  LABORATORY DATA: CBC: Recent Labs  Lab 03/16/18 2115 03/18/18 1319 03/19/18 0352  WBC 6.8 6.7 7.4  HGB 9.5* 8.4* 8.5*  HCT 32.0* 28.4* 26.8*  MCV 109.6* 108.4* 105.5*  PLT 121* 122* 124*    Basic Metabolic Panel: Recent Labs  Lab 03/16/18 2115 03/18/18 1319 03/20/18 0331  NA 138 137 139  K 4.0 4.2 4.1  CL 103 100 101  CO2 28 26 27   GLUCOSE 87 158* 161*  BUN 67* 62* 62*  CREATININE 4.55* 4.27* 3.95*  CALCIUM 8.6* 8.7* 9.0    GFR: Estimated Creatinine Clearance: 13.1 mL/min (A) (by C-G formula based on SCr of 3.95 mg/dL (H)).  Liver Function Tests: Recent Labs  Lab 03/16/18 2115  AST 38  ALT 22  ALKPHOS 90  BILITOT 0.6  PROT 6.1*  ALBUMIN 3.1*   No results for input(s): LIPASE, AMYLASE in the last 168 hours. No results for input(s): AMMONIA in the last 168 hours.  Coagulation Profile: No results for input(s): INR, PROTIME in the last 168 hours.  Cardiac Enzymes: No results for input(s): CKTOTAL, CKMB, CKMBINDEX, TROPONINI in the last 168 hours.  BNP (last 3 results) No results for input(s): PROBNP in the last 8760 hours.  HbA1C: Recent Labs    03/17/18 1913  HGBA1C 7.2*    CBG: Recent Labs  Lab 03/19/18 1153 03/19/18 1618 03/19/18 2219 03/20/18 0737 03/20/18 1156  GLUCAP 146* 128* 128* 166* 183*    Lipid Profile: No results for input(s): CHOL, HDL, LDLCALC, TRIG, CHOLHDL, LDLDIRECT in the last 72 hours.  Thyroid Function Tests: No results for input(s): TSH, T4TOTAL, FREET4, T3FREE, THYROIDAB in the last 72 hours.  Anemia Panel: No results for input(s):  VITAMINB12, FOLATE, FERRITIN, TIBC, IRON, RETICCTPCT in the last 72 hours.  Urine analysis: No results found for: COLORURINE, APPEARANCEUR, LABSPEC, PHURINE, GLUCOSEU, HGBUR, BILIRUBINUR, KETONESUR, PROTEINUR, UROBILINOGEN, NITRITE, LEUKOCYTESUR  Sepsis Labs: Lactic Acid, Venous No results found for: LATICACIDVEN  MICROBIOLOGY: Recent Results (from the past 240 hour(s))  MRSA PCR Screening     Status: Abnormal   Collection Time: 03/17/18  9:13 AM  Result Value Ref Range Status   MRSA by PCR POSITIVE (A) NEGATIVE Final    Comment:        The GeneXpert MRSA Assay (FDA approved for NASAL specimens only), is one component of a comprehensive MRSA colonization surveillance program. It is not intended to diagnose MRSA infection nor to guide or monitor treatment for MRSA infections. RESULT CALLED TO, READ BACK BY AND VERIFIED WITH: S. Ward RN 11:30 03/17/18 ([email or web address]) Performed at [facility], [place] [address] [place], [place] [address]     RADIOLOGY STUDIES/RESULTS: No results found.   LOS: 2 days  Oren Binet, MD  [facility]  If 7PM-7AM, please contact night-coverage  Please page via [email or web address]-Password [email or web address]-click on MD name and type text message  03/20/2018, 12:17 PM

## 2018-03-20 NOTE — Discharge Summary (Signed)
PATIENT DETAILS Name: Justin Christensen Age: 84 y.o. Sex: male Date of Birth: Apr 10, 1931 MRN: [id number]. Admitting Physician: Mercy Riding, MD HTD:[name]  Admit Date: 03/16/2018 Discharge date: 03/21/2018  Recommendations for Outpatient Follow-up:  1. Follow up with PCP in 1-2 weeks 2. Please obtain BMP/CBC in one week 3. Allow mild permissive hyperglycemia-recheck A1c-and when it is closer to 7.5-8.0-restart oral hypoglycemic agents. 4. Ensure outpatient follow-up with nephrology.  Admitted From:  Home  Disposition: SNF   Home Health: No  Equipment/Devices: None  Discharge Condition: Stable  CODE STATUS: FULL CODE  Diet recommendation:  Heart Healthy / Carb Modified  Brief Summary: See H&P, Labs, Consult and Test reports for all details in brief, Patient is a 84 y.o. male with history of DM-2, stage V CKD, PAF on anticoagulation, COPD admitted with hypoglycemia secondary to accidental ingestion of extra tablets of glipizide, patient was monitored in the stepdown unit, provided supportive care with IV dextrose, octreotide.  Evaluated by physical therapy with recommendations to transfer to SNF.  See below for further details.  Brief Hospital Course: Hypoglycemia: Secondary to accidental ingestion of extra tablets of glipizide in the setting of advanced CKD.  Hypoglycemia has resolved with supportive care.  DM-2: CBG stable-A1c 7.2-given advanced age and multiple medical issues-candidate A1c increased to 8.  Would avoid aggressive control of his sugars.  Continue SSI on discharge.  CKD stage IV-V: Creatinine improved-close to usual baseline.  On Lasix with improvement in lower extremity edema.  Continue to follow weights/electrolytes in the outpatient setting.    Chronic diastolic heart failure/severe pulmonary hypertension (TTE on 03/05/2018 in care everywhere-showed EF 55-60%, severe pulmonary hypertension with RSVP of more than 60 mmHg): Is improving  edema in the lower extremities with reinitiation of Lasix.  Continue to follow closely in the outpatient setting.  Repeat electrolytes periodically.    Chronic A. fib: Rate controlled--continue Eliquis.  Hypertension: Moderate controlled-continue Lasix and hydralazine-add amlodipine-follow and adjust further in the outpatient setting.   Dyslipidemia: Continue with statin  COPD: Continue bronchodilators-stable-no exacerbation.  Depression: Stable-continue Cymbalta  Procedures/Studies: None  Discharge Diagnoses:  Principal Problem:   Hypoglycemia Active Problems:   CKD (chronic kidney disease), stage V (HCC)   HTN (hypertension)   HLD (hyperlipidemia)   AF (paroxysmal atrial fibrillation) (HCC)   COPD (chronic obstructive pulmonary disease) (HCC)   CHF (congestive heart failure) (HCC)   Chronic anemia   Thrombocytopenia (HCC)   BPH (benign prostatic hyperplasia)   Depression   Chronic pain   Discharge Instructions:  Activity:  As tolerated with Full fall precautions use walker/cane & assistance as needed   Discharge Instructions    (HEART FAILURE PATIENTS) Call MD:  Anytime you have any of the following symptoms: 1) 3 pound weight gain in 24 hours or 5 pounds in 1 week 2) shortness of breath, with or without a dry hacking cough 3) swelling in the hands, feet or stomach 4) if you have to sleep on extra pillows at night in order to breathe.   Complete by:  As directed    Diet - low sodium heart healthy   Complete by:  As directed    Diet Carb Modified   Complete by:  As directed    Discharge instructions   Complete by:  As directed    Follow with Primary MD  Clinic, [facility] in 1 week  Please get a complete blood count and chemistry panel checked by your Primary MD at your next  visit, and again as instructed by your Primary MD.  Get Medicines reviewed and adjusted: Please take all your medications with you for your next visit with your Primary  MD  Laboratory/radiological data: Please request your Primary MD to go over all hospital tests and procedure/radiological results at the follow up, please ask your Primary MD to get all Hospital records sent to his/her office.  In some cases, they will be blood work, cultures and biopsy results pending at the time of your discharge. Please request that your primary care M.D. follows up on these results.  Also Note the following: If you experience worsening of your admission symptoms, develop shortness of breath, life threatening emergency, suicidal or homicidal thoughts you must seek medical attention immediately by calling 911 or calling your MD immediately  if symptoms less severe.  You must read complete instructions/literature along with all the possible adverse reactions/side effects for all the Medicines you take and that have been prescribed to you. Take any new Medicines after you have completely understood and accpet all the possible adverse reactions/side effects.   Do not drive when taking Pain medications or sleeping medications (Benzodaizepines)  Do not take more than prescribed Pain, Sleep and Anxiety Medications. It is not advisable to combine anxiety,sleep and pain medications without talking with your primary care practitioner  Special Instructions: If you have smoked or chewed Tobacco  in the last 2 yrs please stop smoking, stop any regular Alcohol  and or any Recreational drug use.  Wear Seat belts while driving.  Please note: You were cared for by a hospitalist during your hospital stay. Once you are discharged, your primary care physician will handle any further medical issues. Please note that NO REFILLS for any discharge medications will be authorized once you are discharged, as it is imperative that you return to your primary care physician (or establish a relationship with a primary care physician if you do not have one) for your post hospital discharge needs so that  they can reassess your need for medications and monitor your lab values.   Increase activity slowly   Complete by:  As directed      Allergies as of 03/20/2018   No Known Allergies     Medication List    STOP taking these medications   glipiZIDE 10 MG tablet Commonly known as:  GLUCOTROL     TAKE these medications   acetaminophen 325 MG tablet Commonly known as:  TYLENOL Take 325 mg by mouth every 6 (six) hours as needed (for pain or headaches).   albuterol (2.5 MG/3ML) 0.083% nebulizer solution Commonly known as:  PROVENTIL Take 2.5 mg by nebulization every 6 (six) hours as needed for wheezing or shortness of breath.   PROVENTIL HFA 108 (90 Base) MCG/ACT inhaler Generic drug:  albuterol Inhale 1-2 puffs into the lungs every 6 (six) hours as needed for wheezing or shortness of breath.   amLODipine 5 MG tablet Commonly known as:  NORVASC Take 1 tablet (5 mg total) by mouth daily.   DULoxetine 30 MG capsule Commonly known as:  CYMBALTA Take 30 mg by mouth daily.   ELIQUIS 2.5 MG Tabs tablet Generic drug:  apixaban Take 2.5 mg by mouth 2 (two) times daily.   feeding supplement (GLUCERNA SHAKE) Liqd Take 237 mLs by mouth 3 (three) times daily between meals.   furosemide 80 MG tablet Commonly known as:  LASIX Take 1 tablet (80 mg total) by mouth 2 (two) times daily.   hydrALAZINE  25 MG tablet Commonly known as:  APRESOLINE Take 2 tablets (50 mg total) by mouth 3 (three) times daily. What changed:  how much to take   HYDROcodone-acetaminophen 5-325 MG tablet Commonly known as:  NORCO/VICODIN Take 1 tablet by mouth every 8 (eight) hours as needed (for pain).   insulin aspart 100 UNIT/ML injection Commonly known as:  novoLOG CBG < 70: implement hypoglycemia protocol CBG 70 - 120: 0 units CBG 121 - 150: 2 units CBG 151 - 200: 3 units CBG 201 - 250: 5 units CBG 251 - 300: 8 units CBG 301 - 350: 11 units CBG 351 - 400: 15 units CBG > 400: call MD    methocarbamol 500 MG tablet Commonly known as:  ROBAXIN Take 500 mg by mouth 3 (three) times daily as needed for muscle spasms.   ONE-A-DAY MENS 50+ ADVANTAGE Tabs Take 1 tablet by mouth daily.   simethicone 80 MG chewable tablet Commonly known as:  MYLICON Chew 80 mg by mouth every 6 (six) hours as needed for flatulence.   simvastatin 40 MG tablet Commonly known as:  ZOCOR Take 40 mg by mouth at bedtime.   SPIRIVA RESPIMAT 1.25 MCG/ACT Aers Generic drug:  Tiotropium Bromide Monohydrate Inhale 1 puff into the lungs daily.   terazosin 5 MG capsule Commonly known as:  HYTRIN Take 5 mg by mouth at bedtime.       Contact information for follow-up providers    Clinic, [place] Schedule an appointment as soon as possible for a visit in 1 week(s).   Contact information: [place] [address] [phone]            Contact information for after-discharge care    Destination    [facility] .   Service:  Skilled Nursing Contact information: [address] [place] [phone]                 No Known Allergies  Consultations:   None  Other Procedures/Studies:  No results found.   TODAY-DAY OF DISCHARGE:  Subjective:   Kyshawn Mearns today has no headache,no chest abdominal pain,no new weakness tingling or numbness, feels much better wants to go home today.   Objective:   Blood pressure (!) 165/88, pulse 88, temperature 98.9 F (37.2 C), temperature source Oral, resp. rate 18, height 5\' 5"  (1.651 m), weight 77.3 kg, SpO2 92 %.  Intake/Output Summary (Last 24 hours) at 03/20/2018 1225 Last data filed at 03/20/2018 1019 Gross per 24 hour  Intake 683 ml  Output 2130 ml  Net -1447 ml   Filed Weights   03/17/18 0900 03/19/18 0852 03/20/18 0500  Weight: 82.9 kg 80.4 kg 77.3 kg    Exam: Awake Alert, Oriented *3, No new F.N  deficits, Normal affect [place].AT,PERRAL Supple Neck,No JVD, No cervical lymphadenopathy appriciated.  Symmetrical Chest wall movement, Good air movement bilaterally, CTAB RRR,No Gallops,Rubs or new Murmurs, No Parasternal Heave +ve B.Sounds, Abd Soft, Non tender, No organomegaly appriciated, No rebound -guarding or rigidity. No Cyanosis, Clubbing or edema, No new Rash or bruise   PERTINENT RADIOLOGIC STUDIES: No results found.   PERTINENT LAB RESULTS: CBC: Recent Labs    03/18/18 1319 03/19/18 0352  WBC 6.7 7.4  HGB 8.4* 8.5*  HCT 28.4* 26.8*  PLT 122* 124*   CMET CMP     Component Value Date/Time   NA 139 03/20/2018 0331   K 4.1 03/20/2018  0331   CL 101 03/20/2018 0331   CO2 27 03/20/2018 0331   GLUCOSE 161 (H) 03/20/2018 0331   BUN 62 (H) 03/20/2018 0331   CREATININE 3.95 (H) 03/20/2018 0331   CALCIUM 9.0 03/20/2018 0331   PROT 6.1 (L) 03/16/2018 2115   ALBUMIN 3.1 (L) 03/16/2018 2115   AST 38 03/16/2018 2115   ALT 22 03/16/2018 2115   ALKPHOS 90 03/16/2018 2115   BILITOT 0.6 03/16/2018 2115   GFRNONAA 13 (L) 03/20/2018 0331   GFRAA 15 (L) 03/20/2018 0331    GFR Estimated Creatinine Clearance: 13.1 mL/min (A) (by C-G formula based on SCr of 3.95 mg/dL (H)). No results for input(s): LIPASE, AMYLASE in the last 72 hours. No results for input(s): CKTOTAL, CKMB, CKMBINDEX, TROPONINI in the last 72 hours. Invalid input(s): POCBNP No results for input(s): DDIMER in the last 72 hours. Recent Labs    03/17/18 1913  HGBA1C 7.2*   No results for input(s): CHOL, HDL, LDLCALC, TRIG, CHOLHDL, LDLDIRECT in the last 72 hours. No results for input(s): TSH, T4TOTAL, T3FREE, THYROIDAB in the last 72 hours.  Invalid input(s): FREET3 No results for input(s): VITAMINB12, FOLATE, FERRITIN, TIBC, IRON, RETICCTPCT in the last 72 hours. Coags: No results for input(s): INR in the last 72 hours.  Invalid input(s): PT Microbiology: Recent Results (from the past 240 hour(s))   MRSA PCR Screening     Status: Abnormal   Collection Time: 03/17/18  9:13 AM  Result Value Ref Range Status   MRSA by PCR POSITIVE (A) NEGATIVE Final    Comment:        The GeneXpert MRSA Assay (FDA approved for NASAL specimens only), is one component of a comprehensive MRSA colonization surveillance program. It is not intended to diagnose MRSA infection nor to guide or monitor treatment for MRSA infections. RESULT CALLED TO, READ BACK BY AND VERIFIED WITH: S. Ward RN 11:30 03/17/18 ([email or web address]) Performed at [facility], [place] [address], [place], [place] [address]     FURTHER DISCHARGE INSTRUCTIONS:  Get Medicines reviewed and adjusted: Please take all your medications with you for your next visit with your Primary MD  Laboratory/radiological data: Please request your Primary MD to go over all hospital tests and procedure/radiological results at the follow up, please ask your Primary MD to get all Hospital records sent to his/her office.  In some cases, they will be blood work, cultures and biopsy results pending at the time of your discharge. Please request that your primary care M.D. goes through all the records of your hospital data and follows up on these results.  Also Note the following: If you experience worsening of your admission symptoms, develop shortness of breath, life threatening emergency, suicidal or homicidal thoughts you must seek medical attention immediately by calling 911 or calling your MD immediately  if symptoms less severe.  You must read complete instructions/literature along with all the possible adverse reactions/side effects for all the Medicines you take and that have been prescribed to you. Take any new Medicines after you have completely understood and accpet all the possible adverse reactions/side effects.   Do not drive when taking Pain medications or sleeping medications (Benzodaizepines)  Do not take more than prescribed Pain, Sleep and  Anxiety Medications. It is not advisable to combine anxiety,sleep and pain medications without talking with your primary care practitioner  Special Instructions: If you have smoked or chewed Tobacco  in the last 2 yrs please stop smoking, stop any regular Alcohol  and or any Recreational drug use.  Wear Seat belts while driving.  Please note: You were cared for by a hospitalist during your hospital stay. Once you are discharged, your primary care physician will handle any further medical issues. Please note that NO REFILLS for any discharge medications will be authorized once you are discharged, as it is imperative that you return to your primary care physician (or establish a relationship with a primary care physician if you do not have one) for your post hospital discharge needs so that they can reassess your need for medications and monitor your lab values.  Total Time spent coordinating discharge including counseling, education and face to face time equals 35 minutes.  SignedOren Binet 03/20/2018 12:25 PM

## 2018-03-20 NOTE — Progress Notes (Signed)
CSW following for discharge. CSW received alert from MD that discharge summary for tomorrow is in the chart so that it can be faxed to SNF. CSW contacted [place] [phone]) at [facility] to fax summary. Fax number to [place] is [phone]. CSW attempted to fax summary multiple times; unable to complete fax. CSW contacted [facility] again, the facility is having trouble with their fax machines. Nurse just asked CSW to keep trying. CSW reached out to Admissions at [place], to ask about anything else that can be done to provide discharge summary to the nurse station so they can obtain his meds for tomorrow; no response at this time.  CSW to continue to try to fax discharge summary and follow for transition to SNF.  Laveda Abbe, [place] Clinical Social Worker [phone]

## 2018-03-20 NOTE — Progress Notes (Signed)
2nd shift ED CSW received a handoff from the 1st shift CSW.   CSW has been attempting to fax pt's D/C summary to [facility] at fax: [phone] but fax's still aren't being transmitted.  CSW will continue to attempt faxing D/C summary.  CSW will continue to follow for D/C needs.  Alphonse Guild. Charan Prieto, LCSW, LCAS, CSI Clinical Social Worker Ph: [phone]

## 2018-03-21 LAB — GLUCOSE, CAPILLARY
GLUCOSE-CAPILLARY: 218 mg/dL — AB (ref 70–99)
GLUCOSE-CAPILLARY: 166 mg/dL — AB (ref 70–99)
Glucose-Capillary: 153 mg/dL — ABNORMAL HIGH (ref 70–99)
Glucose-Capillary: 151 mg/dL — ABNORMAL HIGH (ref 70–99)

## 2018-03-21 NOTE — Progress Notes (Addendum)
CSW spoke with [facility] for pt's disposition.  CSW was informed that the fax machine is inoperable and will not be repaired until 10:00am tomorrow morning. The facility will not be able to fax the pt's prescriptions to the pharmacy to be able to receive at the facility.  CSW consulted with physician and patient will discharge on Monday per physician.  CSW will informed facility.  CSW will continue to follow.  Reed Breech LCSWA [phone]

## 2018-03-21 NOTE — Progress Notes (Signed)
[facility]                                                                                                                                                                         Patient Demographics  Justin Christensen, is a 84 y.o. male  KLK:[id number]  [id number]  DOB - 1931/04/30  Admit date - 03/16/2018  Admitting Physician Mercy Riding, MD  Outpatient Primary MD for the patient is [name]  LOS - 3   Chief Complaint  Patient presents with  . Hypoglycemia        Assessment & Plan    Patient seen briefly today due for discharge soon per Discharge done yesterday by Dr Sloan Leiter, no further issues, Vital signs stable, patient feels fine.      Medications  Scheduled Meds: . amLODipine  5 mg Oral Daily  . apixaban  2.5 mg Oral BID  . DULoxetine  30 mg Oral Daily  . feeding supplement (GLUCERNA SHAKE)  237 mL Oral TID BM  . furosemide  80 mg Oral BID  . hydrALAZINE  50 mg Oral TID  . insulin aspart  0-15 Units Subcutaneous TID WC  . insulin aspart  0-5 Units Subcutaneous QHS  . mouth rinse  15 mL Mouth Rinse BID  . multivitamin with minerals  1 tablet Oral Daily  . mupirocin ointment  1 application Nasal BID  . simvastatin  40 mg Oral QHS  . sodium chloride flush  3 mL Intravenous Q12H  . terazosin  5 mg Oral QHS  . tiotropium  1 capsule Inhalation Daily   Continuous Infusions: . sodium chloride     PRN Meds:.sodium chloride, acetaminophen, albuterol, HYDROcodone-acetaminophen, metoprolol tartrate, simethicone, sodium chloride flush    Time Spent in minutes   10 minutes   Justin Christensen M.D on 03/21/2018 at 11:29 AM  Between 7am to 7pm - Pager - [phone]  After 7pm go to [email or web address] - password [email or web address]  And look for the night coverage person covering for me after hours  [facility] Office  [phone]    Subjective:   Justin Christensen today has, No  headache, No chest pain, No abdominal pain - No Nausea, No new weakness tingling or numbness, No Cough - SOB.    Objective:   Vitals:   03/20/18 2059 03/21/18 0455 03/21/18 0500 03/21/18 0924  BP: (!) 164/82 (!) 152/87  (!) 161/81  Pulse: 98 (!) 113 94   Resp: 16 18    Temp: 98.1 F (36.7 C) (!) 97.5 F (36.4 C)    TempSrc: Oral Oral    SpO2: 95% 90% 93%   Weight:   73.2 kg   Height:  Wt Readings from Last 3 Encounters:  03/21/18 73.2 kg     Intake/Output Summary (Last 24 hours) at 03/21/2018 1129 Last data filed at 03/21/2018 1100 Gross per 24 hour  Intake 383 ml  Output 3100 ml  Net -2717 ml    Exam Awake Alert,   No new F.N deficits, Normal affect [place].AT,PERRAL Supple Neck,No JVD, No cervical lymphadenopathy appriciated.  Symmetrical Chest wall movement, Good air movement bilaterally, CTAB RRR,No Gallops,Rubs or new Murmurs, No Parasternal Heave +ve B.Sounds, Abd Soft, Non tender, No organomegaly appriciated, No rebound - guarding or rigidity. No Cyanosis, Clubbing or edema, No new Rash or bruise    Data Review

## 2018-03-22 LAB — GLUCOSE, CAPILLARY
Glucose-Capillary: 91 mg/dL (ref 70–99)
Glucose-Capillary: 191 mg/dL — ABNORMAL HIGH (ref 70–99)

## 2018-03-22 NOTE — Progress Notes (Signed)
Patient will DC to: [facility] Anticipated DC date: 03/22/18 Family notified: Daughter Transport by: PTAR 11:30am   Per MD patient ready for DC to [facility]. RN, patient, patient's family, and facility notified of DC. Discharge Summary and FL2 sent to facility. RN to call report prior to discharge ([phone]). DC packet on chart. Ambulance transport requested for patient.   CSW will sign off for now as social work intervention is no longer needed. Please consult [place] again if new needs arise.  Cedric Fishman, LCSW Clinical Social Worker [phone]

## 2018-03-22 NOTE — Plan of Care (Signed)
  Problem: Education: Goal: Knowledge of General Education information will improve Description Including pain rating scale, medication(s)/side effects and non-pharmacologic comfort measures Outcome: Adequate for Discharge   Problem: Health Behavior/Discharge Planning: Goal: Ability to manage health-related needs will improve Outcome: Adequate for Discharge   Problem: Clinical Measurements: Goal: Ability to maintain clinical measurements within normal limits will improve Outcome: Adequate for Discharge Goal: Will remain free from infection Outcome: Adequate for Discharge Goal: Diagnostic test results will improve Outcome: Adequate for Discharge Goal: Respiratory complications will improve Outcome: Adequate for Discharge Goal: Cardiovascular complication will be avoided Outcome: Adequate for Discharge   Problem: Activity: Goal: Risk for activity intolerance will decrease Outcome: Adequate for Discharge   Problem: Elimination: Goal: Will not experience complications related to bowel motility Outcome: Adequate for Discharge Goal: Will not experience complications related to urinary retention Outcome: Adequate for Discharge   Problem: Skin Integrity: Goal: Risk for impaired skin integrity will decrease Outcome: Adequate for Discharge

## 2018-03-22 NOTE — Progress Notes (Signed)
[facility]                                                                                                                                                                         Patient Demographics  Justin Christensen, is a 84 y.o. male  ACZ:[id number]  [id number]  DOB - 08/04/1930  Admit date - 03/16/2018  Admitting Physician Justin Riding, MD  Outpatient Primary MD for the patient is [name]  LOS - 4   Chief Complaint  Patient presents with  . Hypoglycemia        Assessment & Plan    Patient seen briefly today due for discharge soon per Discharge done by Dr Justin Christensen, no further issues, Vital signs stable, patient feels fine.  Patient did not leave Yesterday as SNF FAX machine was down and they were unable to take him.   Medications  Scheduled Meds: . amLODipine  5 mg Oral Daily  . apixaban  2.5 mg Oral BID  . DULoxetine  30 mg Oral Daily  . feeding supplement (GLUCERNA SHAKE)  237 mL Oral TID BM  . furosemide  80 mg Oral BID  . hydrALAZINE  50 mg Oral TID  . insulin aspart  0-15 Units Subcutaneous TID WC  . insulin aspart  0-5 Units Subcutaneous QHS  . mouth rinse  15 mL Mouth Rinse BID  . multivitamin with minerals  1 tablet Oral Daily  . simvastatin  40 mg Oral QHS  . sodium chloride flush  3 mL Intravenous Q12H  . terazosin  5 mg Oral QHS  . tiotropium  1 capsule Inhalation Daily   Continuous Infusions: . sodium chloride     PRN Meds:.sodium chloride, acetaminophen, albuterol, HYDROcodone-acetaminophen, metoprolol tartrate, simethicone, sodium chloride flush    Time Spent in minutes   10 minutes   Justin Christensen M.D on 03/22/2018 at 8:12 AM  Between 7am to 7pm - Pager - [phone]  After 7pm go to [email or web address] - password [email or web address]  And look for the night coverage person covering for me after hours  [facility] Office  [phone]     Subjective:   Justin Christensen today has, No headache, No chest pain, No abdominal pain - No Nausea, No new weakness tingling or numbness, No Cough - SOB.    Objective:   Vitals:   03/21/18 1632 03/21/18 1718 03/21/18 2131 03/22/18 0511  BP:  137/90 (!) 142/87 (!) 149/88  Pulse: 78  (!) 105 91  Resp:   17 17  Temp:   98.7 F (37.1 C) 98.6 F (37 C)  TempSrc:      SpO2: 92%  91% 92%  Weight:      Height:  Wt Readings from Last 3 Encounters:  03/21/18 73.2 kg     Intake/Output Summary (Last 24 hours) at 03/22/2018 0812 Last data filed at 03/22/2018 0511 Gross per 24 hour  Intake 386 ml  Output 1600 ml  Net -1214 ml    Exam Awake Alert,   No new F.N deficits, Normal affect [place].AT,PERRAL Supple Neck,No JVD, No cervical lymphadenopathy appriciated.  Symmetrical Chest wall movement, Good air movement bilaterally, CTAB RRR,No Gallops,Rubs or new Murmurs, No Parasternal Heave +ve B.Sounds, Abd Soft, Non tender, No organomegaly appriciated, No rebound - guarding or rigidity. No Cyanosis, Clubbing or edema, No new Rash or bruise    Data Review

## 2018-03-22 NOTE — Clinical Social Work Placement (Signed)
   CLINICAL SOCIAL WORK PLACEMENT  NOTE  Date:  03/22/2018  Patient Details  Name: Justin Christensen MRN: [id number] Date of Birth: 1930-06-15  Clinical Social Work is seeking post-discharge placement for this patient at the [place] level of care (*CSW will initial, date and re-position this form in  chart as items are completed):  Yes   Patient/family provided with [place] Work Department's list of facilities offering this level of care within the geographic area requested by the patient (or if unable, by the patient's family).  Yes   Patient/family informed of their freedom to choose among providers that offer the needed level of care, that participate in Medicare, Medicaid or managed care program needed by the patient, have an available bed and are willing to accept the patient.  Yes   Patient/family informed of [place]'s ownership interest in [facility] and [facility], as well as of the fact that they are under no obligation to receive care at these facilities.  PASRR submitted to EDS on 03/22/18     PASRR number received on 03/22/18     Existing PASRR number confirmed on       FL2 transmitted to all facilities in geographic area requested by pt/family on 03/22/18     FL2 transmitted to all facilities within larger geographic area on       Patient informed that his/her managed care company has contracts with or will negotiate with certain facilities, including the following:        Yes   Patient/family informed of bed offers received.  Patient chooses bed at [facility]     Physician recommends and patient chooses bed at      Patient to be transferred to [facility] on 03/22/18.  Patient to be transferred to facility by [facility]     Patient family notified on 03/22/18 of transfer.  Name of family member notified:  Daughter, [name]     PHYSICIAN       Additional Comment:     _______________________________________________ Benard Halsted, LCSW 03/22/2018, 10:48 AM

## 2018-05-21 ENCOUNTER — Inpatient Hospital Stay (HOSPITAL_COMMUNITY): Payer: Medicare Other

## 2018-05-21 ENCOUNTER — Encounter (HOSPITAL_COMMUNITY): Payer: Self-pay | Admitting: *Deleted

## 2018-05-21 ENCOUNTER — Inpatient Hospital Stay (HOSPITAL_COMMUNITY)
Admission: AD | Admit: 2018-05-21 | Discharge: 2018-06-10 | DRG: 698 | Disposition: A | Discharge status: Skilled Nursing Facility | Payer: Medicare Other | Source: Other Acute Inpatient Hospital | Attending: Internal Medicine | Admitting: Internal Medicine

## 2018-05-21 DIAGNOSIS — J441 Chronic obstructive pulmonary disease with (acute) exacerbation: Secondary | ICD-10-CM | POA: Diagnosis not present

## 2018-05-21 DIAGNOSIS — N179 Acute kidney failure, unspecified: Secondary | ICD-10-CM | POA: Diagnosis present

## 2018-05-21 DIAGNOSIS — N185 Chronic kidney disease, stage 5: Secondary | ICD-10-CM | POA: Diagnosis present

## 2018-05-21 DIAGNOSIS — Y92239 Unspecified place in hospital as the place of occurrence of the external cause: Secondary | ICD-10-CM | POA: Diagnosis not present

## 2018-05-21 DIAGNOSIS — R627 Adult failure to thrive: Secondary | ICD-10-CM | POA: Diagnosis present

## 2018-05-21 DIAGNOSIS — D696 Thrombocytopenia, unspecified: Secondary | ICD-10-CM | POA: Diagnosis present

## 2018-05-21 DIAGNOSIS — S3730XA Unspecified injury of urethra, initial encounter: Secondary | ICD-10-CM

## 2018-05-21 DIAGNOSIS — R319 Hematuria, unspecified: Secondary | ICD-10-CM | POA: Diagnosis present

## 2018-05-21 DIAGNOSIS — I120 Hypertensive chronic kidney disease with stage 5 chronic kidney disease or end stage renal disease: Secondary | ICD-10-CM | POA: Diagnosis present

## 2018-05-21 DIAGNOSIS — I480 Paroxysmal atrial fibrillation: Secondary | ICD-10-CM | POA: Diagnosis present

## 2018-05-21 DIAGNOSIS — Z9981 Dependence on supplemental oxygen: Secondary | ICD-10-CM | POA: Diagnosis not present

## 2018-05-21 DIAGNOSIS — Z7901 Long term (current) use of anticoagulants: Secondary | ICD-10-CM

## 2018-05-21 DIAGNOSIS — N400 Enlarged prostate without lower urinary tract symptoms: Secondary | ICD-10-CM | POA: Diagnosis present

## 2018-05-21 DIAGNOSIS — I272 Pulmonary hypertension, unspecified: Secondary | ICD-10-CM | POA: Diagnosis present

## 2018-05-21 DIAGNOSIS — Z79891 Long term (current) use of opiate analgesic: Secondary | ICD-10-CM

## 2018-05-21 DIAGNOSIS — D62 Acute posthemorrhagic anemia: Secondary | ICD-10-CM | POA: Diagnosis present

## 2018-05-21 DIAGNOSIS — Z794 Long term (current) use of insulin: Secondary | ICD-10-CM | POA: Diagnosis not present

## 2018-05-21 DIAGNOSIS — E11649 Type 2 diabetes mellitus with hypoglycemia without coma: Secondary | ICD-10-CM | POA: Diagnosis present

## 2018-05-21 DIAGNOSIS — J438 Other emphysema: Secondary | ICD-10-CM | POA: Diagnosis not present

## 2018-05-21 DIAGNOSIS — E8770 Fluid overload, unspecified: Secondary | ICD-10-CM | POA: Diagnosis present

## 2018-05-21 DIAGNOSIS — J449 Chronic obstructive pulmonary disease, unspecified: Secondary | ICD-10-CM | POA: Diagnosis present

## 2018-05-21 DIAGNOSIS — N183 Chronic kidney disease, stage 3 (moderate): Secondary | ICD-10-CM

## 2018-05-21 DIAGNOSIS — E162 Hypoglycemia, unspecified: Secondary | ICD-10-CM | POA: Diagnosis present

## 2018-05-21 DIAGNOSIS — I10 Essential (primary) hypertension: Secondary | ICD-10-CM | POA: Diagnosis not present

## 2018-05-21 DIAGNOSIS — F03918 Unspecified dementia, unspecified severity, with other behavioral disturbance: Secondary | ICD-10-CM | POA: Diagnosis present

## 2018-05-21 DIAGNOSIS — D631 Anemia in chronic kidney disease: Secondary | ICD-10-CM | POA: Diagnosis present

## 2018-05-21 DIAGNOSIS — F0391 Unspecified dementia with behavioral disturbance: Secondary | ICD-10-CM | POA: Diagnosis present

## 2018-05-21 DIAGNOSIS — E1122 Type 2 diabetes mellitus with diabetic chronic kidney disease: Secondary | ICD-10-CM | POA: Diagnosis present

## 2018-05-21 DIAGNOSIS — Y846 Urinary catheterization as the cause of abnormal reaction of the patient, or of later complication, without mention of misadventure at the time of the procedure: Secondary | ICD-10-CM | POA: Diagnosis not present

## 2018-05-21 DIAGNOSIS — G9341 Metabolic encephalopathy: Secondary | ICD-10-CM | POA: Diagnosis present

## 2018-05-21 DIAGNOSIS — E872 Acidosis: Secondary | ICD-10-CM | POA: Diagnosis present

## 2018-05-21 DIAGNOSIS — J9611 Chronic respiratory failure with hypoxia: Secondary | ICD-10-CM | POA: Diagnosis present

## 2018-05-21 DIAGNOSIS — R310 Gross hematuria: Secondary | ICD-10-CM | POA: Diagnosis present

## 2018-05-21 DIAGNOSIS — D649 Anemia, unspecified: Secondary | ICD-10-CM | POA: Diagnosis present

## 2018-05-21 DIAGNOSIS — E875 Hyperkalemia: Secondary | ICD-10-CM | POA: Diagnosis not present

## 2018-05-21 DIAGNOSIS — Z87442 Personal history of urinary calculi: Secondary | ICD-10-CM | POA: Diagnosis not present

## 2018-05-21 DIAGNOSIS — Z6828 Body mass index (BMI) 28.0-28.9, adult: Secondary | ICD-10-CM

## 2018-05-21 DIAGNOSIS — E785 Hyperlipidemia, unspecified: Secondary | ICD-10-CM | POA: Diagnosis present

## 2018-05-21 DIAGNOSIS — E1165 Type 2 diabetes mellitus with hyperglycemia: Secondary | ICD-10-CM | POA: Diagnosis present

## 2018-05-21 DIAGNOSIS — T380X5A Adverse effect of glucocorticoids and synthetic analogues, initial encounter: Secondary | ICD-10-CM | POA: Diagnosis not present

## 2018-05-21 DIAGNOSIS — Z79899 Other long term (current) drug therapy: Secondary | ICD-10-CM

## 2018-05-21 DIAGNOSIS — R680 Hypothermia, not associated with low environmental temperature: Secondary | ICD-10-CM | POA: Diagnosis not present

## 2018-05-21 LAB — COMPREHENSIVE METABOLIC PANEL
ALT: 34 U/L (ref 0–44)
AST: 27 U/L (ref 15–41)
Albumin: 3.2 g/dL — ABNORMAL LOW (ref 3.5–5.0)
Alkaline Phosphatase: 86 U/L (ref 38–126)
Anion gap: 10 (ref 5–15)
BUN: 96 mg/dL — ABNORMAL HIGH (ref 8–23)
CO2: 20 mmol/L — ABNORMAL LOW (ref 22–32)
Calcium: 8.3 mg/dL — ABNORMAL LOW (ref 8.9–10.3)
Chloride: 105 mmol/L (ref 98–111)
Creatinine, Ser: 4.44 mg/dL — ABNORMAL HIGH (ref 0.61–1.24)
GFR calc Af Amer: 13 mL/min — ABNORMAL LOW (ref >60)
GFR, EST NON AFRICAN AMERICAN: 11 mL/min — AB (ref >60)
Glucose, Bld: 178 mg/dL — ABNORMAL HIGH (ref 70–99)
Potassium: 5 mmol/L (ref 3.5–5.1)
Sodium: 135 mmol/L (ref 135–145)
Total Bilirubin: 0.8 mg/dL (ref 0.3–1.2)
Total Protein: 6.2 g/dL — ABNORMAL LOW (ref 6.5–8.1)

## 2018-05-21 LAB — CBC
HCT: 25.3 % — ABNORMAL LOW (ref 39.0–52.0)
Hemoglobin: 7.5 g/dL — ABNORMAL LOW (ref 13.0–17.0)
MCH: 32.8 pg (ref 26.0–34.0)
MCHC: 29.6 g/dL — ABNORMAL LOW (ref 30.0–36.0)
MCV: 110.5 fL — ABNORMAL HIGH (ref 80.0–100.0)
Platelets: 129 10*3/uL — ABNORMAL LOW (ref 150–400)
RBC: 2.29 MIL/uL — ABNORMAL LOW (ref 4.22–5.81)
RDW: 15.4 % (ref 11.5–15.5)
WBC: 7.4 10*3/uL (ref 4.0–10.5)
nRBC: 0 % (ref 0.0–0.2)

## 2018-05-21 LAB — SAVE SMEAR(SSMR), FOR PROVIDER SLIDE REVIEW

## 2018-05-21 LAB — BRAIN NATRIURETIC PEPTIDE: B NATRIURETIC PEPTIDE 5: 615.1 pg/mL — AB (ref 0.0–100.0)

## 2018-05-21 LAB — GLUCOSE, CAPILLARY: Glucose-Capillary: 162 mg/dL — ABNORMAL HIGH (ref 70–99)

## 2018-05-21 MED ORDER — INSULIN ASPART 100 UNIT/ML ~~LOC~~ SOLN
0.0000 [IU] | Freq: Three times a day (TID) | SUBCUTANEOUS | Status: DC
  Administered 2018-05-22: 1 [IU] via SUBCUTANEOUS
  Administered 2018-05-22 – 2018-05-23 (×3): 2 [IU] via SUBCUTANEOUS
  Administered 2018-05-23 – 2018-05-24 (×2): 1 [IU] via SUBCUTANEOUS
  Administered 2018-05-24 – 2018-05-26 (×6): 2 [IU] via SUBCUTANEOUS
  Administered 2018-05-27 (×2): 3 [IU] via SUBCUTANEOUS
  Administered 2018-05-27 – 2018-05-28 (×2): 2 [IU] via SUBCUTANEOUS
  Administered 2018-05-28: 7 [IU] via SUBCUTANEOUS
  Administered 2018-05-28 – 2018-05-29 (×3): 5 [IU] via SUBCUTANEOUS
  Administered 2018-05-29 – 2018-05-30 (×3): 2 [IU] via SUBCUTANEOUS
  Administered 2018-05-30: 1 [IU] via SUBCUTANEOUS
  Administered 2018-05-31 (×2): 9 [IU] via SUBCUTANEOUS
  Administered 2018-05-31: 2 [IU] via SUBCUTANEOUS
  Administered 2018-06-01 (×3): 5 [IU] via SUBCUTANEOUS
  Administered 2018-06-02: 2 [IU] via SUBCUTANEOUS
  Administered 2018-06-02: 7 [IU] via SUBCUTANEOUS

## 2018-05-21 MED ORDER — TERAZOSIN HCL 5 MG PO CAPS
5.0000 mg | ORAL_CAPSULE | Freq: Every day | ORAL | Status: DC
Start: 2018-05-22 — End: 2018-05-23
  Administered 2018-05-22: 5 mg via ORAL
  Filled 2018-05-21 (×3): qty 1

## 2018-05-21 MED ORDER — DULOXETINE HCL 30 MG PO CPEP: 30.0000 mg | ORAL_CAPSULE | Freq: Every day | ORAL | Status: DC

## 2018-05-21 MED ORDER — SODIUM CHLORIDE 0.9 % IV SOLN: 250.0000 mL | INTRAVENOUS | Status: DC | PRN

## 2018-05-21 MED ORDER — TIOTROPIUM BROMIDE MONOHYDRATE 1.25 MCG/ACT IN AERS: 1.0000 | INHALATION_SPRAY | Freq: Every day | RESPIRATORY_TRACT | Status: DC

## 2018-05-21 MED ORDER — AMLODIPINE BESYLATE 5 MG PO TABS
5.0000 mg | ORAL_TABLET | Freq: Every day | ORAL | Status: DC
  Administered 2018-05-22 – 2018-05-23 (×2): 5 mg via ORAL
  Filled 2018-05-21 (×2): qty 1

## 2018-05-21 MED ORDER — SODIUM CHLORIDE 0.9% FLUSH
3.0000 mL | Freq: Two times a day (BID) | INTRAVENOUS | Status: DC
  Administered 2018-05-22 – 2018-06-10 (×40): 3 mL via INTRAVENOUS

## 2018-05-21 MED ORDER — SIMVASTATIN 40 MG PO TABS
40.0000 mg | ORAL_TABLET | Freq: Every day | ORAL | Status: DC
  Administered 2018-05-22: 40 mg via ORAL
  Filled 2018-05-21: qty 1

## 2018-05-21 MED ORDER — SODIUM CHLORIDE 0.9% FLUSH
3.0000 mL | INTRAVENOUS | Status: DC | PRN
  Administered 2018-05-24: 3 mL via INTRAVENOUS
  Filled 2018-05-21: qty 3

## 2018-05-21 NOTE — H&P (Addendum)
History and Physical    CHAMBERLAIN STEINBORN [id number]:[id number] DOB: 1930-07-03 DOA: 05/21/2018  PCP: [name]  Patient coming from: [facility], resides at home w/ adult daughter   Chief Complaint: hematuria  HPI: Justin Christensen is a 84 y.o. male with medical history significant for DM, mild thrombocytopenia, htn, copd, CKD, pulmonary hypertension, paroxysmal a-fib, bph, nephrolithiasis, and anemia, who presents with above.  Two recent hospitalizations in October. First for AKI with creatinine in the 7s, then later in the month for hypoglycemia thought to be 2/2 sulfonylurea use in the setting of advanced ckd.  Patient admitted to [facility] for hypoglycemia, again thought to be secondary to sulfonylurea use. Was treated with d50 converted to d10, and hypoglycemia eventually resolved. Patient was also treated for presumed volume overload with IV lasix. Patient was catheterized for close fluid monitoring, and last night self-discontinued coudee catheter, which was traumatic. Catheter was re-inserted and has been irrigated but continues to drain bloody urine. Creatinine during hospitalization trended from 3s to 4s. Hemoglobin reported dropped "2 points" from baseline of around 10 and then held stable. Patient's eliquis has been held.   Patient is currently oriented to self but not to place. He is not complaining of pain or difficulty breathing. I called and spoke with his daughter who said his mental status is typically normal.    Review of Systems: As per HPI otherwise 10 point review of systems negative.    History reviewed. No pertinent past medical history.  History reviewed. No pertinent surgical history.   reports that he has never smoked. He has never used smokeless tobacco. He reports previous alcohol use. He reports that he does not use drugs.  No Known Allergies  History reviewed. No pertinent family history.  Prior to Admission medications   Medication Sig Start  Date End Date Taking? Authorizing Provider  acetaminophen (TYLENOL) 325 MG tablet Take 325 mg by mouth every 6 (six) hours as needed (for pain or headaches).    [provider]  albuterol (PROVENTIL HFA) 108 (90 Base) MCG/ACT inhaler Inhale 1-2 puffs into the lungs every 6 (six) hours as needed for wheezing or shortness of breath.    [provider]  albuterol (PROVENTIL) (2.5 MG/3ML) 0.083% nebulizer solution Take 2.5 mg by nebulization every 6 (six) hours as needed for wheezing or shortness of breath.    [provider]  amLODipine (NORVASC) 5 MG tablet Take 1 tablet (5 mg total) by mouth daily. 03/20/18   Ghimire, Henreitta Leber, MD  apixaban (ELIQUIS) 2.5 MG TABS tablet Take 2.5 mg by mouth 2 (two) times daily.    [provider]  DULoxetine (CYMBALTA) 30 MG capsule Take 30 mg by mouth daily.    [provider]  feeding supplement, GLUCERNA SHAKE, (GLUCERNA SHAKE) LIQD Take 237 mLs by mouth 3 (three) times daily between meals. 03/20/18   Ghimire, Henreitta Leber, MD  furosemide (LASIX) 80 MG tablet Take 1 tablet (80 mg total) by mouth 2 (two) times daily. 03/20/18   Ghimire, Henreitta Leber, MD  hydrALAZINE (APRESOLINE) 25 MG tablet Take 2 tablets (50 mg total) by mouth 3 (three) times daily. 03/20/18   Ghimire, Henreitta Leber, MD  HYDROcodone-acetaminophen (NORCO/VICODIN) 5-325 MG tablet Take 1 tablet by mouth every 8 (eight) hours as needed (for pain). 03/20/18   Ghimire, Henreitta Leber, MD  insulin aspart (NOVOLOG) 100 UNIT/ML injection CBG < 70: implement hypoglycemia protocol CBG 70 - 120: 0 units CBG 121 - 150: 2 units  CBG 151 - 200: 3 units CBG 201 - 250: 5 units CBG 251 - 300: 8 units CBG 301 - 350: 11 units CBG 351 - 400: 15 units CBG > 400: call MD 03/20/18   Jonetta Osgood, MD  methocarbamol (ROBAXIN) 500 MG tablet Take 500 mg by mouth 3 (three) times daily as needed for muscle spasms.    [provider]  Multiple Vitamins-Minerals (ONE-A-DAY MENS  50+ ADVANTAGE) TABS Take 1 tablet by mouth daily.    [provider]  simethicone (MYLICON) 80 MG chewable tablet Chew 80 mg by mouth every 6 (six) hours as needed for flatulence.    [provider]  simvastatin (ZOCOR) 40 MG tablet Take 40 mg by mouth at bedtime.    [provider]  terazosin (HYTRIN) 5 MG capsule Take 5 mg by mouth at bedtime.    [provider]  Tiotropium Bromide Monohydrate (SPIRIVA RESPIMAT) 1.25 MCG/ACT AERS Inhale 1 puff into the lungs daily.    [provider]    Physical Exam: Vitals:   05/21/18 1902 05/21/18 1906  BP: (!) 122/57   Pulse: 66   Temp: 98.3 F (36.8 C)   TempSrc: Oral   SpO2: 91%   Weight:  77.9 kg  Height:  5\' 5"  (1.651 m)    Constitutional: No acute distress, lying flat Head: Atraumatic Eyes: Conjunctiva clear ENM: Moist mucous membranes. Poor dentition.  Neck: Supple Respiratory: Clear to auscultation bilaterally, no wheezing or rhonchi, faint rales at bases, poor inspiratory effort Cardiovascular: Regular rate and rhythm. Distant heart sounds. Systolic moderate murmur. Abdomen: Non-tender, non-distended. No masses. No rebound or guarding. Positive bowel sounds. Musculoskeletal: No joint deformity upper and lower extremities. Normal ROM, no contractures. Normal muscle tone.  Skin: No rashes, lesions, or ulcers save for a few superficial skin tears on b/l arms at sites of IVs. Extremities: trace LE pittingl edema. Palpable peripheral pulses. Neurologic: Alert, moving all 4 extremities. Psychiatric: Normal insight and judgement. GU: foley catheter in place draining red bloody urine   Labs on Admission: I have personally reviewed following labs and imaging studies  CBC: No results for input(s): WBC, NEUTROABS, HGB, HCT, MCV, PLT in the last 168 hours. Basic Metabolic Panel: No results for input(s): NA, K, CL, CO2, GLUCOSE, BUN, CREATININE, CALCIUM, MG, PHOS in the last 168 hours. GFR: CrCl  cannot be calculated (Patient's most recent lab result is older than the maximum 21 days allowed.). Liver Function Tests: No results for input(s): AST, ALT, ALKPHOS, BILITOT, PROT, ALBUMIN in the last 168 hours. No results for input(s): LIPASE, AMYLASE in the last 168 hours. No results for input(s): AMMONIA in the last 168 hours. Coagulation Profile: No results for input(s): INR, PROTIME in the last 168 hours. Cardiac Enzymes: No results for input(s): CKTOTAL, CKMB, CKMBINDEX, TROPONINI in the last 168 hours. BNP (last 3 results) No results for input(s): PROBNP in the last 8760 hours. HbA1C: No results for input(s): HGBA1C in the last 72 hours. CBG: Recent Labs  Lab 05/21/18 2121  GLUCAP 162*   Lipid Profile: No results for input(s): CHOL, HDL, LDLCALC, TRIG, CHOLHDL, LDLDIRECT in the last 72 hours. Thyroid Function Tests: No results for input(s): TSH, T4TOTAL, FREET4, T3FREE, THYROIDAB in the last 72 hours. Anemia Panel: No results for input(s): VITAMINB12, FOLATE, FERRITIN, TIBC, IRON, RETICCTPCT in the last 72 hours. Urine analysis: No results found for: COLORURINE, APPEARANCEUR, LABSPEC, PHURINE, GLUCOSEU, HGBUR, BILIRUBINUR, KETONESUR, PROTEINUR, UROBILINOGEN, NITRITE, LEUKOCYTESUR  Radiological Exams on Admission: No  results found.   Assessment/Plan Active Problems:   Hypoglycemia   CKD (chronic kidney disease), stage V (HCC)   HTN (hypertension)   AF (paroxysmal atrial fibrillation) (HCC)   COPD (chronic obstructive pulmonary disease) (HCC)   Chronic anemia   Thrombocytopenia (HCC)   BPH (benign prostatic hyperplasia)   Urethral trauma   Pulmonary hypertension, moderate to severe ([place])   # Hypoglycemia - appears to be resolved, likely 2/2 sulfonylurea use in setting of advanced ckd. Was discontinued after most recent hospitalization; pt and family will need careful counseling. - SSI, monitor glucose  # CKD- from what I can tell using care everywhere, pt with  baseline creatinine in the 2s as recently as 12/2017. Then admitted for aki where cr peaked in the 7s. At most recent hospitalization here for hypoglycemia cr in the 3s, and at [facility] from where he just came, initially 3s treanding to 4s. I suspect this ckd 4/5 is new baseline; pt was receiving IV lasix so may also be prerenal. Currently lying flat, only trace edema, no respiratory distress, so think volume overload less likely. Some degree of uremia may be contributing to current delirium - checking bmp to start - check bnp, cxr - resume home lasix for now  # urethral trauma - from self-dc of coudee catheter. From what I can gather, one of the reasons for transfer is urology input. hgb appears to have dropped to 8s from baseline 10s. - am urology consult - continue foley catheter for now, does appear to be draining urine - f/u cbc  # delirium - likely 2/2 hospitalazion and acute illness - delirium precautions - hold home prn opioid - remove foley soon as able  # chronic hypoxemia - on o2 at home, unsure what dose. Stable. - [place] O2  # paroxysmal a-fib - here normal rate at time of my exam - check ekg, placing on tele, d/c once rate confirmed to be stable as excess monitoring will only contribute to delirium - holding home eliquis due to acute bleeding  # thrombocytopenia - mild, appears to be chronic - hiv, hcv, smear  # htn # copd - both stable - home meds  DVT prophylaxis: scds for now Code Status: full  Family Communication: daughter [name] [phone]  Disposition Plan: tbd  Consults called: will need to call urology in am  Admission status: med/surg    Desma Maxim MD [facility] Pager [phone]  If 7PM-7AM, please contact night-coverage [email or web address] Password [facility]  05/21/2018, 9:24 PM

## 2018-05-22 ENCOUNTER — Other Ambulatory Visit: Payer: Self-pay

## 2018-05-22 LAB — URINALYSIS, ROUTINE W REFLEX MICROSCOPIC

## 2018-05-22 LAB — GLUCOSE, CAPILLARY
GLUCOSE-CAPILLARY: 169 mg/dL — AB (ref 70–99)
Glucose-Capillary: 175 mg/dL — ABNORMAL HIGH (ref 70–99)
Glucose-Capillary: 131 mg/dL — ABNORMAL HIGH (ref 70–99)
Glucose-Capillary: 143 mg/dL — ABNORMAL HIGH (ref 70–99)

## 2018-05-22 LAB — VITAMIN B12: Vitamin B-12: 473 pg/mL (ref 180–914)

## 2018-05-22 LAB — FERRITIN: Ferritin: 69 ng/mL (ref 24–336)

## 2018-05-22 LAB — URINALYSIS, MICROSCOPIC (REFLEX): RBC / HPF: >50 RBC/hpf (ref 0–5)

## 2018-05-22 LAB — HIV ANTIBODY (ROUTINE TESTING W REFLEX): HIV Screen 4th Generation wRfx: NONREACTIVE

## 2018-05-22 LAB — IRON AND TIBC
IRON: 22 ug/dL — AB (ref 45–182)
Saturation Ratios: 9 % — ABNORMAL LOW (ref 17.9–39.5)
TIBC: 246 ug/dL — ABNORMAL LOW (ref 250–450)
UIBC: 224 ug/dL

## 2018-05-22 MED ORDER — TIOTROPIUM BROMIDE MONOHYDRATE 18 MCG IN CAPS: 18.0000 ug | ORAL_CAPSULE | Freq: Every day | RESPIRATORY_TRACT | Status: DC

## 2018-05-22 MED ORDER — ATORVASTATIN CALCIUM 20 MG PO TABS
20.0000 mg | ORAL_TABLET | Freq: Every day | ORAL | Status: DC
  Administered 2018-05-22 – 2018-06-09 (×19): 20 mg via ORAL
  Filled 2018-05-22 (×20): qty 1

## 2018-05-22 MED ORDER — UMECLIDINIUM BROMIDE 62.5 MCG/INH IN AEPB
1.0000 | INHALATION_SPRAY | Freq: Every day | RESPIRATORY_TRACT | Status: DC
  Administered 2018-05-22 – 2018-06-09 (×19): 1 via RESPIRATORY_TRACT
  Filled 2018-05-22 (×3): qty 7

## 2018-05-22 NOTE — Progress Notes (Signed)
PROGRESS NOTE  Justin Christensen CLE:[id number] DOB: Nov 06, 1930 DOA: 05/21/2018 PCP: [name]  Brief Summary:  This is a 84 y.o. male with a PMH significant for DM, mild thrombocytopenia, HTN, COPD, pulmonary HTN, atrial fibrillation on eliquis, nephrolithiasis, CKD and anemia who presented to an OSH with hypoglycemia, thought to be secondary to sulfonylurea use. Received hydration, then developed volume overload received lasix. Foley placed for monitor urine output, he developed delirium and   self-discontinued coudee catheter, which was traumatic. Catheter was re-inserted and has been irrigated but continues to drain bloody urine.   Creatinine during hospitalization trended from 3s to 4s. Hemoglobin reported dropped "2 points" from baseline of around 10 and then held stable. Patient's eliquis has been held.  Patient is transferred to [facility] for urology evaluation.   HPI/Recap of past 24 hours:  Patient is confused, not able to provide history, but cooperative Denies pain Gross hematuria in Foley No fever On 02 supplement  Assessment/Plan: Active Problems:   Hypoglycemia   CKD (chronic kidney disease), stage V (HCC)   HTN (hypertension)   AF (paroxysmal atrial fibrillation) (HCC)   COPD (chronic obstructive pulmonary disease) (HCC)   Chronic anemia   Thrombocytopenia (HCC)   BPH (benign prostatic hyperplasia)   Urethral trauma   Pulmonary hypertension, moderate to severe (HCC)  Gross hematuria: -h/o bph/nephrolithiasis, -likely from traumatic foley removal due to delirium in the setting of eliquis,  eliquis currently on hold -urology consulted -monitor hgb  AKI on CKD vs progressive CKD -per chart review, he was hospitalized in 03/2018  for AKI with creatinine in the 7s Cr during hospitalization at [facility] prior to transfer to [place] has been around 3-4 -monitor renal function  Anemia of chronic disease Now with acute blood loss from gross  hematoria Monitor hgb  Acute metabolic Encephalopathy/delirum Daughter report at baseline patient has no confusion  paroxysmal a-fib Rate controlled (not on rate control meds at home) eliquis held due to gross hematuria   COPD on home 02 No wheezing, no cough      Code Status: full  Family Communication: patient   Disposition Plan: Not ready to discharge   Consultants:  Urology  Procedures:  None  Antibiotics:  none   Objective: BP 120/90 (BP Location: Right Arm)   Pulse (!) 57   Temp 97.7 F (36.5 C) (Axillary)   Resp (!) 24   Ht 5\' 5"  (1.651 m)   Wt 77.9 kg   SpO2 96%   BMI 28.58 kg/m   Intake/Output Summary (Last 24 hours) at 05/22/2018 1608 Last data filed at 05/22/2018 0000 Gross per 24 hour  Intake 160 ml  Output -  Net 160 ml   Filed Weights   05/21/18 1906  Weight: 77.9 kg    Exam: Patient is examined daily including today on 05/22/2018, exams remain the same as of yesterday except that has changed    General: Frail, confused , cooperative, gross hematuria in Foley  Cardiovascular: IRRR  Respiratory: CTABL  Abdomen: Soft/ND/NT, positive BS  Musculoskeletal: No Edema  Neuro: Confused  Scattered ecchymosis upper extremity  Data Reviewed: Basic Metabolic Panel: Recent Labs  Lab 05/21/18 2251  NA 135  K 5.0  CL 105  CO2 20*  GLUCOSE 178*  BUN 96*  CREATININE 4.44*  CALCIUM 8.3*   Liver Function Tests: Recent Labs  Lab 05/21/18 2251  AST 27  ALT 34  ALKPHOS 86  BILITOT 0.8  PROT 6.2*  ALBUMIN 3.2*  No results for input(s): LIPASE, AMYLASE in the last 168 hours. No results for input(s): AMMONIA in the last 168 hours. CBC: Recent Labs  Lab 05/21/18 2251  WBC 7.4  HGB 7.5*  HCT 25.3*  MCV 110.5*  PLT 129*   Cardiac Enzymes:   No results for input(s): CKTOTAL, CKMB, CKMBINDEX, TROPONINI in the last 168 hours. BNP (last 3 results) Recent Labs    05/21/18 2251  BNP 615.1*    ProBNP (last 3  results) No results for input(s): PROBNP in the last 8760 hours.  CBG: Recent Labs  Lab 05/21/18 2121 05/22/18 0750 05/22/18 1150  GLUCAP 162* 175* 169*    No results found for this or any previous visit (from the past 240 hour(s)).   Studies: Dg Chest 2 View  Result Date: 05/22/2018 CLINICAL DATA:  Acute kidney injury. EXAM: CHEST - 2 VIEW COMPARISON:  Chest radiograph May 17, 2018 FINDINGS: Cardiac silhouette is upper limits of normal in size. Calcified aortic arch. Small RIGHT pleural effusion. Bronchitic changes/interstitial changes. No pneumothorax. Soft tissue planes and included osseous structures are non suspicious. IMPRESSION: Small RIGHT pleural effusion. Similar bronchitic changes/chronic interstitial lung disease. Aortic Atherosclerosis (ICD10-I70.0). Electronically Signed   By: Elon Alas M.D.   On: 05/22/2018 00:33    Scheduled Meds: . amLODipine  5 mg Oral Daily  . atorvastatin  20 mg Oral QHS  . insulin aspart  0-9 Units Subcutaneous TID WC  . sodium chloride flush  3 mL Intravenous Q12H  . terazosin  5 mg Oral QHS  . umeclidinium bromide  1 puff Inhalation Daily    Continuous Infusions: . sodium chloride       Time spent: 72mins, case discussed with urology resident  I have personally reviewed and interpreted on  05/22/2018 daily labs, tele strips, imagings as discussed above under date review session and assessment and plans.  I reviewed all nursing notes, pharmacy notes, consultant notes,  vitals, pertinent old records  I have discussed plan of care as described above with RN , patient  on 05/22/2018   Florencia Reasons MD, PhD  [facility] Pager [phone]. If 7PM-7AM, please contact night-coverage at [email or web address], password [facility] 05/22/2018, 4:08 PM  LOS: 1 day

## 2018-05-22 NOTE — Consult Note (Signed)
0Urology Consult Note   Requesting Attending Physician:  Florencia Reasons, MD Service Providing Consult: Urology  Consulting Attending: Irine Seal, MD   Reason for Consult:  Hematuria  HPI: Justin Christensen is seen in consultation for reasons noted above at the request of Florencia Reasons, MD for evaluation of gross hematuria.  This is a 84 y.o. male with a PMH significant for DM, mild thrombocytopenia, HTN, COPD, pulmonary HTN, atrial fibrillation on eliquis, nephrolithiasis, CKD and anemia who presented to an OSH with hypoglycemia and volume overload.    Patient had a catheter in place for accurate ins and outs and removed his own catheter (per documentation, on 05/20/18?). Catheter was replaced and hematuria noted. Transferred to [facility] due to hematuria.   Creatinine also increased from 3s to 4s during hospitalization in setting of aggressive diuresis with IV lasix. It appears as though at discharge in October (when hospitalized for creatinine in 7s), creatinine was 3.95, so his new baseline is likely high 3-4s.    Past Medical History: History reviewed. No pertinent past medical history.  Past Surgical History:  History reviewed. No pertinent surgical history.  Medication: Current Facility-Administered Medications  Medication Dose Route Frequency Provider Last Rate Last Dose  . 0.9 %  sodium chloride infusion  250 mL Intravenous PRN Wouk, Ailene Rud, MD      . amLODipine (NORVASC) tablet 5 mg  5 mg Oral Daily Gwynne Edinger, MD   5 mg at 05/22/18 0925  . insulin aspart (novoLOG) injection 0-9 Units  0-9 Units Subcutaneous TID WC Wouk, Ailene Rud, MD   2 Units at 05/22/18 [phone]  . simvastatin (ZOCOR) tablet 40 mg  40 mg Oral QHS Gwynne Edinger, MD   40 mg at 05/22/18 0040  . sodium chloride flush (NS) 0.9 % injection 3 mL  3 mL Intravenous Q12H Wouk, Ailene Rud, MD   3 mL at 05/22/18 0041  . sodium chloride flush (NS) 0.9 % injection 3 mL  3 mL Intravenous PRN Wouk, Ailene Rud, MD       . terazosin (HYTRIN) capsule 5 mg  5 mg Oral QHS Wouk, Ailene Rud, MD      . umeclidinium bromide (INCRUSE ELLIPTA) 62.5 MCG/INH 1 puff  1 puff Inhalation Daily Lenis Noon, RPH        Allergies: No Known Allergies  Social History: Social History   Tobacco Use  . Smoking status: Never Smoker  . Smokeless tobacco: Never Used  Substance Use Topics  . Alcohol use: Not Currently    Frequency: Never  . Drug use: Never    Family History History reviewed. No pertinent family history.  Review of Systems 10 systems were reviewed and are negative except as noted specifically in the HPI.  Objective   Vital signs in last 24 hours: BP 120/62 (BP Location: Right Arm)   Pulse 74   Temp (!) 97.4 F (36.3 C) (Oral)   Resp 18   Ht 5\' 5"  (1.651 m)   Wt 77.9 kg   SpO2 91%   BMI 28.58 kg/m   Physical Exam General: NAD, resting, oriented to self and county HEENT: [place]/AT, EOMI Pulmonary: Normal work of breathing Cardiovascular: HDS, adequate peripheral perfusion Abdomen: Soft, NTTP, nondistended. GU: foley draining clear amber urine spontaneously.  Extremities: warm and well perfused Neuro: Appropriate, no focal neurological deficits  Most Recent Labs: Lab Results  Component Value Date   WBC 7.4 05/21/2018   HGB 7.5 (L) 05/21/2018   HCT  25.3 (L) 05/21/2018   PLT 129 (L) 05/21/2018    Lab Results  Component Value Date   NA 135 05/21/2018   K 5.0 05/21/2018   CL 105 05/21/2018   CO2 20 (L) 05/21/2018   BUN 96 (H) 05/21/2018   CREATININE 4.44 (H) 05/21/2018   CALCIUM 8.3 (L) 05/21/2018    No results found for: INR, APTT   IMAGING: Dg Chest 2 View  Result Date: 05/22/2018 CLINICAL DATA:  Acute kidney injury. EXAM: CHEST - 2 VIEW COMPARISON:  Chest radiograph May 17, 2018 FINDINGS: Cardiac silhouette is upper limits of normal in size. Calcified aortic arch. Small RIGHT pleural effusion. Bronchitic changes/interstitial changes. No pneumothorax. Soft tissue  planes and included osseous structures are non suspicious. IMPRESSION: Small RIGHT pleural effusion. Similar bronchitic changes/chronic interstitial lung disease. Aortic Atherosclerosis (ICD10-I70.0). Electronically Signed   By: Elon Alas M.D.   On: 05/22/2018 00:33    ------  Assessment:  84 y.o. male with multiple medical comorbidities including confusion who has gross hematuria following self-removal of foley catheter.   Urine is amber in color and spontaneously draining well without evidence of clots. Two small clots with hand irrigation.   Recommendations: - Continue foley catheter to drainage. Will see tomorrow to trend hematuria.  - RN to irrigate prn for clots / obstruction.     Thank you for this consult. Please contact the urology consult pager with any further questions/concerns.

## 2018-05-23 LAB — BASIC METABOLIC PANEL
ANION GAP: 9 (ref 5–15)
BUN: 106 mg/dL — ABNORMAL HIGH (ref 8–23)
CO2: 20 mmol/L — ABNORMAL LOW (ref 22–32)
Calcium: 8.4 mg/dL — ABNORMAL LOW (ref 8.9–10.3)
Chloride: 110 mmol/L (ref 98–111)
Creatinine, Ser: 4.22 mg/dL — ABNORMAL HIGH (ref 0.61–1.24)
GFR calc Af Amer: 14 mL/min — ABNORMAL LOW (ref >60)
GFR, EST NON AFRICAN AMERICAN: 12 mL/min — AB (ref >60)
GLUCOSE: 138 mg/dL — AB (ref 70–99)
Potassium: 5.1 mmol/L (ref 3.5–5.1)
Sodium: 139 mmol/L (ref 135–145)

## 2018-05-23 LAB — HEPATIC FUNCTION PANEL
ALT: 25 U/L (ref 0–44)
AST: 19 U/L (ref 15–41)
Albumin: 3.1 g/dL — ABNORMAL LOW (ref 3.5–5.0)
Alkaline Phosphatase: 76 U/L (ref 38–126)
Bilirubin, Direct: 0.3 mg/dL — ABNORMAL HIGH (ref 0.0–0.2)
Indirect Bilirubin: 0.4 mg/dL (ref 0.3–0.9)
Total Bilirubin: 0.7 mg/dL (ref 0.3–1.2)
Total Protein: 5.5 g/dL — ABNORMAL LOW (ref 6.5–8.1)

## 2018-05-23 LAB — HEMOGLOBIN AND HEMATOCRIT, BLOOD
HCT: 28.3 % — ABNORMAL LOW (ref 39.0–52.0)
Hemoglobin: 8.8 g/dL — ABNORMAL LOW (ref 13.0–17.0)

## 2018-05-23 LAB — GLUCOSE, CAPILLARY
Glucose-Capillary: 130 mg/dL — ABNORMAL HIGH (ref 70–99)
Glucose-Capillary: 116 mg/dL — ABNORMAL HIGH (ref 70–99)

## 2018-05-23 LAB — HCV AB W REFLEX TO QUANT PCR

## 2018-05-23 LAB — FOLATE RBC
Folate, Hemolysate: 583.7 ng/mL
Folate, RBC: 2617 ng/mL (ref >498)
Hematocrit: 22.3 % — ABNORMAL LOW (ref 37.5–51.0)

## 2018-05-23 LAB — CBC
HCT: 22 % — ABNORMAL LOW (ref 39.0–52.0)
Hemoglobin: 6.6 g/dL — CL (ref 13.0–17.0)
MCH: 32.7 pg (ref 26.0–34.0)
MCHC: 30 g/dL (ref 30.0–36.0)
MCV: 108.9 fL — ABNORMAL HIGH (ref 80.0–100.0)
NRBC: 0 % (ref 0.0–0.2)
Platelets: 117 10*3/uL — ABNORMAL LOW (ref 150–400)
RBC: 2.02 MIL/uL — AB (ref 4.22–5.81)
RDW: 15.1 % (ref 11.5–15.5)
WBC: 6.1 10*3/uL (ref 4.0–10.5)

## 2018-05-23 LAB — PREPARE RBC (CROSSMATCH)

## 2018-05-23 LAB — ABO/RH: ABO/RH(D): B POS

## 2018-05-23 LAB — AMMONIA: Ammonia: 35 umol/L (ref 9–35)

## 2018-05-23 LAB — HCV INTERPRETATION

## 2018-05-23 MED ORDER — SODIUM CHLORIDE 0.9% IV SOLUTION
Freq: Once | INTRAVENOUS | Status: AC
  Administered 2018-05-23: 11:00:00 via INTRAVENOUS

## 2018-05-23 MED ORDER — TAMSULOSIN HCL 0.4 MG PO CAPS
0.4000 mg | ORAL_CAPSULE | Freq: Every day | ORAL | Status: DC
  Administered 2018-05-24 – 2018-06-10 (×18): 0.4 mg via ORAL
  Filled 2018-05-23 (×18): qty 1

## 2018-05-23 NOTE — Progress Notes (Signed)
PROGRESS NOTE  Justin Christensen PFX:[id number] DOB: [birth year] DOA: 05/21/2018 PCP: [name]  Brief Summary:  This is a 84 y.o. male with a PMH significant for DM, mild thrombocytopenia, HTN, COPD, pulmonary HTN, atrial fibrillation on eliquis, nephrolithiasis, CKD and anemia who presented to an OSH with hypoglycemia, thought to be secondary to sulfonylurea use. Received hydration, then developed volume overload received lasix. Foley placed for monitor urine output, he developed delirium and   self-discontinued coudee catheter, which was traumatic. Catheter was re-inserted and has been irrigated but continues to drain bloody urine.   Creatinine during hospitalization trended from 3s to 4s. Hemoglobin reported dropped "2 points" from baseline of around 10 and then held stable. Patient's eliquis has been held.  Patient is transferred to [facility] long for urology evaluation.   HPI/Recap of past 24 hours:   Weak and frail denies pain, no fever, oriented only to self  Gross hematuria in Foley, hgb dropped to 6.6 this am, two units blood ordered, getting the first unit  On 02 supplement, no cough, no edema  Assessment/Plan: Active Problems:   Hypoglycemia   CKD (chronic kidney disease), stage V (HCC)   HTN (hypertension)   AF (paroxysmal atrial fibrillation) (HCC)   COPD (chronic obstructive pulmonary disease) (HCC)   Chronic anemia   Thrombocytopenia (HCC)   BPH (benign prostatic hyperplasia)   Urethral trauma   Pulmonary hypertension, moderate to severe (HCC)  Gross hematuria: -h/o bph/nephrolithiasis, -likely from traumatic foley removal due to delirium in the setting of eliquis,  eliquis currently on hold -urology consulted -monitor hgb  Anemia of chronic disease Now with acute blood loss from gross hematoria hgb 6.6 this am, getting 2units prbc  AKI on CKD vs progressive CKD -per chart review, he was hospitalized in 03/2018  for AKI with creatinine in the  7s Cr during hospitalization at [facility] prior to transfer to [place] has been around 3-4 -monitor renal function   Acute metabolic Encephalopathy/delirum Daughter report at baseline patient has no confusion He is only oriented to self, but no agitation  paroxysmal a-fib Rate controlled (not on rate control meds at home) eliquis held due to gross hematuria   COPD on home 02 No wheezing, no cough      Code Status: full  Family Communication: patient   Disposition Plan: Not ready to discharge   Consultants:  Urology  Procedures:  None  Antibiotics:  none   Objective: BP 124/71 (BP Location: Right Arm)   Pulse 75   Temp (!) 97.5 F (36.4 C) (Oral)   Resp 16   Ht 5\' 5"  (1.651 m)   Wt 77.9 kg   SpO2 97%   BMI 28.58 kg/m   Intake/Output Summary (Last 24 hours) at 05/23/2018 1222 Last data filed at 05/22/2018 2330 Gross per 24 hour  Intake -  Output 875 ml  Net -875 ml   Filed Weights   05/21/18 1906  Weight: 77.9 kg    Exam: Patient is examined daily including today on 05/23/2018, exams remain the same as of yesterday except that has changed    General: pale, Frail, only oriented to self, no agitation, he is cooperative, gross hematuria in Foley  Cardiovascular: IRRR  Respiratory: CTABL  Abdomen: Soft/ND/NT, positive BS  Musculoskeletal: No Edema  Neuro: Confused  Scattered ecchymosis upper extremity  Data Reviewed: Basic Metabolic Panel: Recent Labs  Lab 05/21/18 2251 05/23/18 0529  NA 135 139  K 5.0 5.1  CL 105 110  CO2 20* 20*  GLUCOSE 178* 138*  BUN 96* 106*  CREATININE 4.44* 4.22*  CALCIUM 8.3* 8.4*   Liver Function Tests: Recent Labs  Lab 05/21/18 2251 05/23/18 0529  AST 27 19  ALT 34 25  ALKPHOS 86 76  BILITOT 0.8 0.7  PROT 6.2* 5.5*  ALBUMIN 3.2* 3.1*   No results for input(s): LIPASE, AMYLASE in the last 168 hours. Recent Labs  Lab 05/23/18 0529  AMMONIA 35   CBC: Recent Labs  Lab  05/21/18 2251 05/23/18 0529  WBC 7.4 6.1  HGB 7.5* 6.6*  HCT 25.3* 22.0*  MCV 110.5* 108.9*  PLT 129* 117*   Cardiac Enzymes:   No results for input(s): CKTOTAL, CKMB, CKMBINDEX, TROPONINI in the last 168 hours. BNP (last 3 results) Recent Labs    05/21/18 2251  BNP 615.1*    ProBNP (last 3 results) No results for input(s): PROBNP in the last 8760 hours.  CBG: Recent Labs  Lab 05/22/18 0750 05/22/18 1150 05/22/18 1639 05/22/18 2117 05/23/18 0815  GLUCAP 175* 169* 131* 143* 130*    No results found for this or any previous visit (from the past 240 hour(s)).   Studies: No results found.  Scheduled Meds: . amLODipine  5 mg Oral Daily  . atorvastatin  20 mg Oral QHS  . insulin aspart  0-9 Units Subcutaneous TID WC  . sodium chloride flush  3 mL Intravenous Q12H  . terazosin  5 mg Oral QHS  . umeclidinium bromide  1 puff Inhalation Daily    Continuous Infusions: . sodium chloride       Time spent: 47mins,  I have personally reviewed and interpreted on  05/23/2018 daily labs, tele strips, imagings as discussed above under date review session and assessment and plans.  I reviewed all nursing notes, pharmacy notes, consultant notes,  vitals, pertinent old records  I have discussed plan of care as described above with RN , patient  on 05/23/2018   Florencia Reasons MD, PhD  [facility] Pager [phone]. If 7PM-7AM, please contact night-coverage at [email or web address], password [facility] 05/23/2018, 12:22 PM  LOS: 2 days

## 2018-05-23 NOTE — Progress Notes (Addendum)
Urology Progress Note      Subjective: NAEON. Patient having some leakage around catheter, but catheter continues to drain. Likely bladder spasms. Reports he is tolerating catheter at this time.   Objective: Vital signs in last 24 hours: Temp:  [97.7 F (36.5 C)-98 F (36.7 C)] 98 F (36.7 C) (12/15 0449) Pulse Rate:  [57-89] 89 (12/15 0449) Resp:  [16-24] 16 (12/15 0449) BP: (113-120)/(56-90) 113/56 (12/15 0449) SpO2:  [90 %-96 %] 93 % (12/15 0923) FiO2 (%):  [2 %] 2 % (12/14 2109)  Intake/Output from previous day: 12/14 0701 - 12/15 0700 In: -  Out: 333 [Urine:875] Intake/Output this shift: No intake/output data recorded.  Physical Exam:  General: Resting appropriately in bed CV: HDS Lungs: NWOB Abdomen: Soft, not grossly distended GU: Foley in place draining clear amber urine - area in tubing very light pink, almost yellow. Hematuria clearing compared to yesterday. Ext: NT, No erythema  Lab Results: Recent Labs    05/21/18 2251 05/23/18 0529  HGB 7.5* 6.6*  HCT 25.3* 22.0*   BMET Recent Labs    05/21/18 2251 05/23/18 0529  NA 135 139  K 5.0 5.1  CL 105 110  CO2 20* 20*  GLUCOSE 178* 138*  BUN 96* 106*  CREATININE 4.44* 4.22*  CALCIUM 8.3* 8.4*     Studies/Results: Dg Chest 2 View  Result Date: 05/22/2018 CLINICAL DATA:  Acute kidney injury. EXAM: CHEST - 2 VIEW COMPARISON:  Chest radiograph May 17, 2018 FINDINGS: Cardiac silhouette is upper limits of normal in size. Calcified aortic arch. Small RIGHT pleural effusion. Bronchitic changes/interstitial changes. No pneumothorax. Soft tissue planes and included osseous structures are non suspicious. IMPRESSION: Small RIGHT pleural effusion. Similar bronchitic changes/chronic interstitial lung disease. Aortic Atherosclerosis (ICD10-I70.0). Electronically Signed   By: Elon Alas M.D.   On: 05/22/2018 00:33    Assessment/Plan:  84 y.o. male with multiple medical comorbidities who was initially  admitted to OSH for hypoglycemia and is now being followed by urology for gross hematuria following self-removal of foley catheter. Doing well with thin, amber colored urine. Minimal clots with flushing and urine continues to clear this morning.   - Unfortunately, given patient's confusion and current creatinine, we do not recommend use of bladder spasm medications at this point. - Continue foley catheter to drainage. Once urine is clear yellow, okay to remove foley catheter and restart eliquis.  - RN to irrigate for clots / obstruction prn.  - RN to place an additional 10 cc of sterile water into the foley catheter balloon to prevent repeat self-removal.     LOS: 2 days   Dorothey Baseman 05/23/2018, 9:32 AM  I have seen pt with Dr Carlean Purl and agree with assessment/plan

## 2018-05-23 NOTE — Progress Notes (Signed)
Nutrition Brief Note  Patient identified on the Malnutrition Screening Tool (MST) Report  Per H&P, pt was recently diuresed with IV Lasix during a previous admission which may account for some weight loss (fluid loss). Overall, pt's weight is still more than it was in October 2019.   Wt Readings from Last 15 Encounters:  05/21/18 77.9 kg  03/21/18 73.2 kg    Body mass index is 28.58 kg/m. Patient meets criteria for overweight based on current BMI.   Current diet order is regular. Labs and medications reviewed.   No nutrition interventions warranted at this time. If nutrition issues arise, please consult RD.   Clayton Bibles, MS, RD, [place] [facility] Pager: [phone] After Hours Pager: [phone]

## 2018-05-24 ENCOUNTER — Other Ambulatory Visit: Payer: Self-pay

## 2018-05-24 DIAGNOSIS — D62 Acute posthemorrhagic anemia: Secondary | ICD-10-CM

## 2018-05-24 DIAGNOSIS — I272 Pulmonary hypertension, unspecified: Secondary | ICD-10-CM

## 2018-05-24 DIAGNOSIS — G9341 Metabolic encephalopathy: Secondary | ICD-10-CM

## 2018-05-24 DIAGNOSIS — N179 Acute kidney failure, unspecified: Secondary | ICD-10-CM

## 2018-05-24 LAB — LIPID PANEL
Cholesterol: 56 mg/dL (ref 0–200)
HDL: 15 mg/dL — AB (ref >40)
LDL Cholesterol: 31 mg/dL (ref 0–99)
Total CHOL/HDL Ratio: 3.7 RATIO
Triglycerides: 50 mg/dL (ref <150)
VLDL: 10 mg/dL (ref 0–40)

## 2018-05-24 LAB — GLUCOSE, CAPILLARY
Glucose-Capillary: 161 mg/dL — ABNORMAL HIGH (ref 70–99)
Glucose-Capillary: 144 mg/dL — ABNORMAL HIGH (ref 70–99)
Glucose-Capillary: 133 mg/dL — ABNORMAL HIGH (ref 70–99)
Glucose-Capillary: 154 mg/dL — ABNORMAL HIGH (ref 70–99)
Glucose-Capillary: 119 mg/dL — ABNORMAL HIGH (ref 70–99)
Glucose-Capillary: 158 mg/dL — ABNORMAL HIGH (ref 70–99)

## 2018-05-24 LAB — BASIC METABOLIC PANEL
ANION GAP: 9 (ref 5–15)
BUN: 98 mg/dL — ABNORMAL HIGH (ref 8–23)
CO2: 21 mmol/L — ABNORMAL LOW (ref 22–32)
Calcium: 8.5 mg/dL — ABNORMAL LOW (ref 8.9–10.3)
Chloride: 111 mmol/L (ref 98–111)
Creatinine, Ser: 3.87 mg/dL — ABNORMAL HIGH (ref 0.61–1.24)
GFR calc non Af Amer: 13 mL/min — ABNORMAL LOW (ref >60)
GFR, EST AFRICAN AMERICAN: 15 mL/min — AB (ref >60)
Glucose, Bld: 158 mg/dL — ABNORMAL HIGH (ref 70–99)
POTASSIUM: 5.3 mmol/L — AB (ref 3.5–5.1)
Sodium: 141 mmol/L (ref 135–145)

## 2018-05-24 LAB — TSH: TSH: 2.705 u[IU]/mL (ref 0.350–4.500)

## 2018-05-24 LAB — TYPE AND SCREEN
ABO/RH(D): B POS
Antibody Screen: NEGATIVE
Unit division: 0
Unit division: 0

## 2018-05-24 LAB — CBC
HCT: 27.2 % — ABNORMAL LOW (ref 39.0–52.0)
Hemoglobin: 8.5 g/dL — ABNORMAL LOW (ref 13.0–17.0)
MCH: 32.4 pg (ref 26.0–34.0)
MCHC: 31.3 g/dL (ref 30.0–36.0)
MCV: 103.8 fL — ABNORMAL HIGH (ref 80.0–100.0)
Platelets: 120 10*3/uL — ABNORMAL LOW (ref 150–400)
RBC: 2.62 MIL/uL — ABNORMAL LOW (ref 4.22–5.81)
RDW: 19.3 % — ABNORMAL HIGH (ref 11.5–15.5)
WBC: 6.2 10*3/uL (ref 4.0–10.5)
nRBC: 0 % (ref 0.0–0.2)

## 2018-05-24 LAB — CK: Total CK: 29 U/L — ABNORMAL LOW (ref 49–397)

## 2018-05-24 LAB — BPAM RBC
Blood Product Expiration Date: 201912202359
Blood Product Expiration Date: 201912202359
ISSUE DATE / TIME: 201912151219
ISSUE DATE / TIME: 201912151521
Unit Type and Rh: 1700
Unit Type and Rh: 1700

## 2018-05-24 MED ORDER — SODIUM POLYSTYRENE SULFONATE 15 GM/60ML PO SUSP
15.0000 g | Freq: Once | ORAL | Status: AC
  Administered 2018-05-24: 15 g via ORAL
  Filled 2018-05-24: qty 60

## 2018-05-24 MED ORDER — SODIUM BICARBONATE 650 MG PO TABS
650.0000 mg | ORAL_TABLET | Freq: Three times a day (TID) | ORAL | Status: DC
  Administered 2018-05-24 – 2018-05-25 (×5): 650 mg via ORAL
  Filled 2018-05-24 (×5): qty 1

## 2018-05-24 NOTE — Progress Notes (Signed)
PROGRESS NOTE  Justin Christensen NID:[id number] DOB: 08-Nov-1930 DOA: 05/21/2018 PCP: [name]  Brief Summary:  This is a 84 y.o. male with a PMH significant for DM, mild thrombocytopenia, HTN, COPD, pulmonary HTN, atrial fibrillation on eliquis, nephrolithiasis, CKD and anemia who presented to an OSH with hypoglycemia, thought to be secondary to sulfonylurea use. Received hydration, then developed volume overload received lasix. Foley placed for monitor urine output, he developed delirium and   self-discontinued coudee catheter, which was traumatic. Catheter was re-inserted and has been irrigated but continues to drain bloody urine.   Creatinine during hospitalization trended from 3s to 4s. Hemoglobin reported dropped "2 points" from baseline of around 10 and then held stable. Patient's eliquis has been held.  Patient is transferred to [facility] long for urology evaluation.   HPI/Recap of past 24 hours:  He appear doing somewhat better, although remain confused, today he is able to states he is in the hospital Urine color is lighter  Weak and frail, denies pain, no fever,   On 02 supplement, no cough, no edema  Assessment/Plan: Active Problems:   Hypoglycemia   CKD (chronic kidney disease), stage V (HCC)   HTN (hypertension)   AF (paroxysmal atrial fibrillation) (HCC)   COPD (chronic obstructive pulmonary disease) (HCC)   Chronic anemia   Thrombocytopenia (HCC)   BPH (benign prostatic hyperplasia)   Urethral trauma   Pulmonary hypertension, moderate to severe (HCC)  Gross hematuria: -h/o bph/nephrolithiasis, -likely from traumatic foley removal due to delirium in the setting of eliquis,  eliquis currently on hold -urology consulted -monitor hgb  Anemia of chronic disease Now with acute blood loss from gross hematoria hgb dropped to 6.6 on 12/15, s/p 2units prbc hgb today is 8.5, repeat cbc in am  AKI on CKD vs progressive CKD -per chart review, he was  hospitalized in 03/2018  for AKI with creatinine in the 7s -Cr during hospitalization at [facility] prior to transfer to [place] has been around 3-4 -monitor renal function, renal dosing meds   Acute metabolic Encephalopathy/delirum Daughter report at baseline patient has no confusion Slightly improved, today he knows he is in the hospital, no agitation  paroxysmal a-fib Rate controlled (not on rate control meds at home, but per outside record in 08/2017 under careeverywhere, he was at one time on atenolol, will ask family regarding this) eliquis held due to gross hematuria   COPD on home 02 No wheezing, no cough   FTT: will get PT eval, suspect will need SNF   Code Status: full  Family Communication: patient   Disposition Plan: Not ready to discharge   Consultants:  Urology  Procedures:  None  Antibiotics:  none   Objective: BP (!) 150/78 (BP Location: Left Arm)   Pulse 71   Temp 98.4 F (36.9 C) (Oral)   Resp 18   Ht 5\' 5"  (1.651 m)   Wt 77.9 kg   SpO2 97%   BMI 28.58 kg/m   Intake/Output Summary (Last 24 hours) at 05/24/2018 1633 Last data filed at 05/24/2018 1121 Gross per 24 hour  Intake 550 ml  Output 1350 ml  Net -800 ml   Filed Weights   05/21/18 1906  Weight: 77.9 kg    Exam: Patient is examined daily including today on 05/24/2018, exams remain the same as of yesterday except that has changed    General: pale, Frail,  oriented to self, today he knows he is in the hospital, no agitation, he is  cooperative,   Cardiovascular: IRRR  Respiratory: CTABL  Abdomen: Soft/ND/NT, positive BS  Musculoskeletal: No Edema  Neuro:less Confused  Scattered ecchymosis upper extremity  Data Reviewed: Basic Metabolic Panel: Recent Labs  Lab 05/21/18 2251 05/23/18 0529 05/24/18 0448  NA 135 139 141  K 5.0 5.1 5.3*  CL 105 110 111  CO2 20* 20* 21*  GLUCOSE 178* 138* 158*  BUN 96* 106* 98*  CREATININE 4.44* 4.22* 3.87*  CALCIUM  8.3* 8.4* 8.5*   Liver Function Tests: Recent Labs  Lab 05/21/18 2251 05/23/18 0529  AST 27 19  ALT 34 25  ALKPHOS 86 76  BILITOT 0.8 0.7  PROT 6.2* 5.5*  ALBUMIN 3.2* 3.1*   No results for input(s): LIPASE, AMYLASE in the last 168 hours. Recent Labs  Lab 05/23/18 0529  AMMONIA 35   CBC: Recent Labs  Lab 05/21/18 2251 05/23/18 0529 05/23/18 2045 05/24/18 0448  WBC 7.4 6.1  --  6.2  HGB 7.5* 6.6* 8.8* 8.5*  HCT 25.3*  22.3* 22.0* 28.3* 27.2*  MCV 110.5* 108.9*  --  103.8*  PLT 129* 117*  --  120*   Cardiac Enzymes:   Recent Labs  Lab 05/24/18 0448  CKTOTAL 29*   BNP (last 3 results) Recent Labs    05/21/18 2251  BNP 615.1*    ProBNP (last 3 results) No results for input(s): PROBNP in the last 8760 hours.  CBG: Recent Labs  Lab 05/23/18 1232 05/23/18 1714 05/23/18 2059 05/24/18 0740 05/24/18 1232  GLUCAP 116* 161* 144* 133* 154*    No results found for this or any previous visit (from the past 240 hour(s)).   Studies: No results found.  Scheduled Meds: . atorvastatin  20 mg Oral QHS  . insulin aspart  0-9 Units Subcutaneous TID WC  . sodium bicarbonate  650 mg Oral TID  . sodium chloride flush  3 mL Intravenous Q12H  . tamsulosin  0.4 mg Oral Daily  . umeclidinium bromide  1 puff Inhalation Daily    Continuous Infusions: . sodium chloride       Time spent: 58mins,  I have personally reviewed and interpreted on  05/24/2018 daily labs, tele strips, imagings as discussed above under date review session and assessment and plans.  I reviewed all nursing notes, pharmacy notes, consultant notes,  vitals, pertinent old records  I have discussed plan of care as described above with RN , patient  on 05/24/2018   Florencia Reasons MD, PhD  [facility] Pager [phone]. If 7PM-7AM, please contact night-coverage at [email or web address], password [facility] 05/24/2018, 4:33 PM  LOS: 3 days

## 2018-05-24 NOTE — Care Management Important Message (Signed)
Important Message  Patient Details  Name: ELIGH RYBACKI MRN: [id number] Date of Birth: 1930-09-28   Medicare Important Message Given:  Yes    Kerin Salen 05/24/2018, 2:46 [place] Message  Patient Details  Name: MALAKHI MARKWOOD MRN: [id number] Date of Birth: [birth year]   Medicare Important Message Given:  Yes    Kerin Salen 05/24/2018, 2:46 PM

## 2018-05-25 DIAGNOSIS — J438 Other emphysema: Secondary | ICD-10-CM

## 2018-05-25 DIAGNOSIS — I272 Pulmonary hypertension, unspecified: Secondary | ICD-10-CM

## 2018-05-25 LAB — CBC
HCT: 29 % — ABNORMAL LOW (ref 39.0–52.0)
Hemoglobin: 8.8 g/dL — ABNORMAL LOW (ref 13.0–17.0)
MCH: 31.8 pg (ref 26.0–34.0)
MCHC: 30.3 g/dL (ref 30.0–36.0)
MCV: 104.7 fL — ABNORMAL HIGH (ref 80.0–100.0)
Platelets: 146 10*3/uL — ABNORMAL LOW (ref 150–400)
RBC: 2.77 MIL/uL — ABNORMAL LOW (ref 4.22–5.81)
RDW: 18.6 % — ABNORMAL HIGH (ref 11.5–15.5)
WBC: 7.1 10*3/uL (ref 4.0–10.5)
nRBC: 0 % (ref 0.0–0.2)

## 2018-05-25 LAB — GLUCOSE, CAPILLARY
Glucose-Capillary: 170 mg/dL — ABNORMAL HIGH (ref 70–99)
Glucose-Capillary: 175 mg/dL — ABNORMAL HIGH (ref 70–99)
Glucose-Capillary: 173 mg/dL — ABNORMAL HIGH (ref 70–99)
Glucose-Capillary: 137 mg/dL — ABNORMAL HIGH (ref 70–99)

## 2018-05-25 LAB — BASIC METABOLIC PANEL
Anion gap: 8 (ref 5–15)
BUN: 88 mg/dL — ABNORMAL HIGH (ref 8–23)
CO2: 24 mmol/L (ref 22–32)
Calcium: 8.6 mg/dL — ABNORMAL LOW (ref 8.9–10.3)
Chloride: 113 mmol/L — ABNORMAL HIGH (ref 98–111)
Creatinine, Ser: 3.26 mg/dL — ABNORMAL HIGH (ref 0.61–1.24)
GFR calc Af Amer: 19 mL/min — ABNORMAL LOW (ref >60)
GFR calc non Af Amer: 16 mL/min — ABNORMAL LOW (ref >60)
GLUCOSE: 217 mg/dL — AB (ref 70–99)
Potassium: 4.6 mmol/L (ref 3.5–5.1)
Sodium: 145 mmol/L (ref 135–145)

## 2018-05-25 LAB — MRSA PCR SCREENING: MRSA by PCR: NEGATIVE

## 2018-05-25 MED ORDER — POLYETHYLENE GLYCOL 3350 17 G PO PACK
17.0000 g | PACK | Freq: Every day | ORAL | Status: DC
  Administered 2018-05-25 – 2018-06-10 (×15): 17 g via ORAL
  Filled 2018-05-25 (×17): qty 1

## 2018-05-25 MED ORDER — IPRATROPIUM BROMIDE 0.02 % IN SOLN
0.5000 mg | Freq: Two times a day (BID) | RESPIRATORY_TRACT | Status: DC
  Administered 2018-05-25 (×2): 0.5 mg via RESPIRATORY_TRACT
  Filled 2018-05-25 (×2): qty 2.5

## 2018-05-25 MED ORDER — LEVALBUTEROL HCL 0.63 MG/3ML IN NEBU
0.6300 mg | INHALATION_SOLUTION | Freq: Two times a day (BID) | RESPIRATORY_TRACT | Status: DC
  Administered 2018-05-25 (×2): 0.63 mg via RESPIRATORY_TRACT
  Filled 2018-05-25 (×2): qty 3

## 2018-05-25 MED ORDER — SENNOSIDES-DOCUSATE SODIUM 8.6-50 MG PO TABS
1.0000 | ORAL_TABLET | Freq: Two times a day (BID) | ORAL | Status: DC
  Administered 2018-05-25 – 2018-06-10 (×30): 1 via ORAL
  Filled 2018-05-25 (×32): qty 1

## 2018-05-25 MED ORDER — ONDANSETRON HCL 4 MG/2ML IJ SOLN
4.0000 mg | Freq: Four times a day (QID) | INTRAMUSCULAR | Status: DC | PRN
  Administered 2018-05-25 – 2018-06-08 (×3): 4 mg via INTRAVENOUS
  Filled 2018-05-25 (×3): qty 2

## 2018-05-25 MED ORDER — CARVEDILOL 3.125 MG PO TABS
3.1250 mg | ORAL_TABLET | Freq: Two times a day (BID) | ORAL | Status: DC
  Administered 2018-05-26 – 2018-06-02 (×16): 3.125 mg via ORAL
  Filled 2018-05-25 (×15): qty 1

## 2018-05-25 NOTE — Evaluation (Signed)
Physical Therapy Evaluation Patient Details Name: Justin Christensen MRN: [id number] DOB: [birth year] Today's Date: 05/25/2018   History of Present Illness  This is a 84 y.o. male with a PMH significant for DM, mild thrombocytopenia, HTN, COPD, pulmonary HTN, atrial fibrillation on eliquis, nephrolithiasis, CKD and anemia who presented to an OSH with hypoglycemia, thought to be secondary to sulfonylurea use. Received hydration, then developed volume overload received lasix. Foley placed for monitor urine output, he developed delirium and   self-discontinued coudee catheter, which was traumatic. Catheter was re-inserted and has been irrigated but continues to drain bloody urine.   Clinical Impression  Patient presents with decreased independence with mobility due to limited activity tolerance, decreased strength in LE's, decreased balance, decreased safety awareness with decreased cognition and will need SNF level rehab at d/c.  Previously staying with his daughter in one level home with steps to enter and able to manage with cane, now needs mod to max A for rising from chair and unable to walk due to pain in knees.  He will benefit from skilled PT in the acute setting for decreased burden of care and maximized mobility.     Follow Up Recommendations SNF;Supervision/Assistance - 24 hour    Equipment Recommendations  Other (comment)(TBA)    Recommendations for Other Services       Precautions / Restrictions Precautions Precautions: Fall Restrictions Weight Bearing Restrictions: No      Mobility  Bed Mobility Overal bed mobility: Needs Assistance             General bed mobility comments: up in chair, RN reports took 2  Transfers Overall transfer level: Needs assistance Equipment used: Rolling walker (2 wheeled) Transfers: Sit to/from Stand Sit to Stand: Mod assist         General transfer comment: increased time, lifting assist from chair, then assist for balance due to  posterior lean  Ambulation/Gait Ambulation/Gait assistance: Mod assist Gait Distance (Feet): 1 Feet Assistive device: Rolling walker (2 wheeled) Gait Pattern/deviations: Step-to pattern;Decreased stride length;Trunk flexed;Shuffle     General Gait Details: reports unable to walk further due to pain, assist for lateral weight shift and able to take two small steps, then backwards steps to chair  Stairs            Wheelchair Mobility    Modified Rankin (Stroke Patients Only)       Balance Overall balance assessment: Needs assistance Sitting-balance support: Feet supported Sitting balance-Leahy Scale: Fair Sitting balance - Comments: sits edge of recliner no LOB   Standing balance support: Bilateral upper extremity supported Standing balance-Leahy Scale: Poor Standing balance comment: posterior lean in standing with UE support min to mod A for balance                             Pertinent Vitals/Pain Pain Assessment: Faces Faces Pain Scale: Hurts even more Pain Location: knees in standing, reports unable to walk Pain Descriptors / Indicators: Aching;Sharp Pain Intervention(s): Monitored during session;Repositioned;Limited activity within patient's tolerance    Home Living Family/patient expects to be discharged to:: Skilled nursing facility                 Additional Comments: reports lives in one level home with daughter, uses a cane takes tub bath, questionable historian    Prior Function                 Hand Dominance   Dominant Hand:  Right    Extremity/Trunk Assessment   Upper Extremity Assessment Upper Extremity Assessment: Overall WFL for tasks assessed(did not test shoulder flexion about 80)    Lower Extremity Assessment Lower Extremity Assessment: RLE deficits/detail;LLE deficits/detail RLE Deficits / Details: limited knee flexion in recliner, okay with feet on floor, strength hip flexion 4/5 LLE Deficits / Details: limited  knee flexion in recliner, okay with feet on floor, strength hip flexion 4/5       Communication   Communication: No difficulties  Cognition Arousal/Alertness: Awake/alert Behavior During Therapy: WFL for tasks assessed/performed Overall Cognitive Status: Impaired/Different from baseline Area of Impairment: Orientation;Attention;Memory                 Orientation Level: Disoriented to;Place;Time;Situation Current Attention Level: Sustained Memory: Decreased short-term memory                General Comments General comments (skin integrity, edema, etc.): on 3L O2 at rest, SpO2 90% on RA standing dropped to 77%, replaced O2 @ 3LPM with improved SpO2 to 90%    Exercises     Assessment/Plan    PT Assessment Patient needs continued PT services  PT Problem List Decreased strength;Decreased balance;Decreased activity tolerance;Decreased mobility;Decreased safety awareness;Decreased knowledge of use of DME       PT Treatment Interventions DME instruction;Functional mobility training;Balance training;Patient/family education;Therapeutic activities;Gait training;Therapeutic exercise    PT Goals (Current goals can be found in the Care Plan section)  Acute Rehab PT Goals Patient Stated Goal: none stated but agreeable to PT PT Goal Formulation: With patient Time For Goal Achievement: 06/08/18 Potential to Achieve Goals: Fair    Frequency Min 2X/week   Barriers to discharge        Co-evaluation               AM-PAC PT "6 Clicks" Mobility  Outcome Measure Help needed turning from your back to your side while in a flat bed without using bedrails?: A Lot Help needed moving from lying on your back to sitting on the side of a flat bed without using bedrails?: A Lot Help needed moving to and from a bed to a chair (including a wheelchair)?: A Lot Help needed standing up from a chair using your arms (e.g., wheelchair or bedside chair)?: A Lot Help needed to walk in  hospital room?: Total Help needed climbing 3-5 steps with a railing? : Total 6 Click Score: 10    End of Session Equipment Utilized During Treatment: Gait belt;Oxygen Activity Tolerance: Patient tolerated treatment well Patient left: with call bell/phone within reach;with chair alarm set;in chair Nurse Communication: Mobility status PT Visit Diagnosis: Other abnormalities of gait and mobility (R26.89);Muscle weakness (generalized) (M62.81);Other symptoms and signs involving the nervous system (R29.898)    Time: 5784-6962 PT Time Calculation (min) (ACUTE ONLY): 25 min   Charges:   PT Evaluation $PT Eval Moderate Complexity: 1 Mod PT Treatments $Therapeutic Activity: 8-22 mins        Magda Kiel, [place] [facility] [phone] 05/25/2018   Reginia Naas 05/25/2018, 11:46 AM

## 2018-05-25 NOTE — Progress Notes (Addendum)
PROGRESS NOTE  Justin Christensen QIH:[id number] DOB: Dec 21, 1930 DOA: 05/21/2018 PCP: [name]  Brief Summary:  This is a 84 y.o. male with a PMH significant for DM, mild thrombocytopenia, HTN, COPD, pulmonary HTN, atrial fibrillation on eliquis, nephrolithiasis, CKD and anemia who presented to an OSH with hypoglycemia, thought to be secondary to sulfonylurea use. Received hydration, then developed volume overload received lasix. Foley placed for monitor urine output, he developed delirium and   self-discontinued coudee catheter, which was traumatic. Catheter was re-inserted and has been irrigated but continues to drain bloody urine.   Creatinine during hospitalization trended from 3s to 4s. Hemoglobin reported dropped "2 points" from baseline of around 10 and then held stable. Patient's eliquis has been held.  Patient is transferred to [facility] for urology evaluation.   HPI/Recap of past 24 hours:  Remain confused, states he is at a hotel, but no agitation, no fever  Urine color is lighter , RN reports there is still some intermittent bleeding around the foley Weak and frail, denies pain,   On 02 supplement, no cough, no edema  Assessment/Plan: Active Problems:   Hypoglycemia   CKD (chronic kidney disease), stage V (HCC)   HTN (hypertension)   AF (paroxysmal atrial fibrillation) (HCC)   COPD (chronic obstructive pulmonary disease) (HCC)   Chronic anemia   Thrombocytopenia (HCC)   BPH (benign prostatic hyperplasia)   Urethral trauma   Pulmonary hypertension, moderate to severe (HCC)   AKI (acute kidney injury) ([place])   Acute blood loss anemia   Acute metabolic encephalopathy  Gross hematuria: -h/o bph/nephrolithiasis, -likely from traumatic foley removal due to delirium in the setting of eliquis,  eliquis currently on hold -urology consulted, per urology on 12/15 "Continue foley catheter to drainage. Once urine is clear yellow, okay to remove foley catheter and  restart eliquis,RN to irrigate prn for clots / obstruction, RN to place an additional 10 cc of sterile water into the foley catheter balloon to prevent repeat self-removal -monitor hgb  Anemia of chronic disease with acute blood loss from gross hematoria hgb dropped to 6.6 on 12/15, s/p 2units prbc hgb has been stable around 8.8 since blood transfuions, repeat cbc in am, if hgb continue to stable and urine clears up, can resume eliquis, likely in 1-2 days  Mild Thrombocytopenia: Appear chronic and improving during this hospitalization  AKI on CKD vs progressive CKD -per chart review, he was hospitalized in 03/2018  for AKI with creatinine in the 7s -Cr during hospitalization at [facility] prior to transfer to [facility] has been around 3-4 -monitor renal function, renal dosing meds  Mild hyperkalemia/metabolic acidosis Resolved with kayexlate x1 and s/p  bicarb supplement  Acute metabolic Encephalopathy/delirum Daughter report at baseline patient has no confusion Remain confused, but cooperative, no agitation  paroxysmal a-fib Rate controlled (not on rate control meds at home, but per outside record in 08/2017 under careeverywhere, he was at one time on atenolol,) eliquis held due to gross hematuria, likely able to resume eliquis on 12/18 if hgb stable, urine continue to clear up. bp start to uptrend, start coreg   COPD on home 02 Today on 12/17 has mild wheezing, no cough, no fever Start nebs bid  FTT:  PT eval recommend SNF   Code Status: full  Family Communication: patient   Disposition Plan: SNF , likely later this week, need to resume eliquis, need voiding trial prior to discharge   Consultants:  Urology  Procedures:  prbc transfusion  x2 on 12/15  Antibiotics:  none   Objective: BP (!) 172/94 (BP Location: Right Arm)   Pulse 77   Temp 98.2 F (36.8 C) (Oral)   Resp 19   Ht 5\' 5"  (1.651 m)   Wt 74.8 kg   SpO2 95%   BMI 27.44 kg/m    Intake/Output Summary (Last 24 hours) at 05/25/2018 1307 Last data filed at 05/25/2018 1250 Gross per 24 hour  Intake 1380 ml  Output 2150 ml  Net -770 ml   Filed Weights   05/21/18 1906 05/25/18 0559  Weight: 77.9 kg 74.8 kg    Exam: Patient is examined daily including today on 05/25/2018, exams remain the same as of yesterday except that has changed    General: pale, Frail,  oriented to self, not to place or time, no agitation, he is cooperative,   Cardiovascular: IRRR  Respiratory: some mild diffuse wheezing bilaterally   Abdomen: Soft/ND/NT, positive BS  Musculoskeletal: No Edema  Neuro:less Confused  Scattered ecchymosis upper extremity  Data Reviewed: Basic Metabolic Panel: Recent Labs  Lab 05/21/18 2251 05/23/18 0529 05/24/18 0448 05/25/18 0538  NA 135 139 141 145  K 5.0 5.1 5.3* 4.6  CL 105 110 111 113*  CO2 20* 20* 21* 24  GLUCOSE 178* 138* 158* 217*  BUN 96* 106* 98* 88*  CREATININE 4.44* 4.22* 3.87* 3.26*  CALCIUM 8.3* 8.4* 8.5* 8.6*   Liver Function Tests: Recent Labs  Lab 05/21/18 2251 05/23/18 0529  AST 27 19  ALT 34 25  ALKPHOS 86 76  BILITOT 0.8 0.7  PROT 6.2* 5.5*  ALBUMIN 3.2* 3.1*   No results for input(s): LIPASE, AMYLASE in the last 168 hours. Recent Labs  Lab 05/23/18 0529  AMMONIA 35   CBC: Recent Labs  Lab 05/21/18 2251 05/23/18 0529 05/23/18 2045 05/24/18 0448 05/25/18 0538  WBC 7.4 6.1  --  6.2 7.1  HGB 7.5* 6.6* 8.8* 8.5* 8.8*  HCT 25.3*  22.3* 22.0* 28.3* 27.2* 29.0*  MCV 110.5* 108.9*  --  103.8* 104.7*  PLT 129* 117*  --  120* 146*   Cardiac Enzymes:   Recent Labs  Lab 05/24/18 0448  CKTOTAL 29*   BNP (last 3 results) Recent Labs    05/21/18 2251  BNP 615.1*    ProBNP (last 3 results) No results for input(s): PROBNP in the last 8760 hours.  CBG: Recent Labs  Lab 05/24/18 1232 05/24/18 1718 05/24/18 2049 05/25/18 0734 05/25/18 1126  GLUCAP 154* 119* 158* 170* 175*    Recent  Results (from the past 240 hour(s))  MRSA PCR Screening     Status: None   Collection Time: 05/25/18  8:53 AM  Result Value Ref Range Status   MRSA by PCR NEGATIVE NEGATIVE Final    Comment:        The GeneXpert MRSA Assay (FDA approved for NASAL specimens only), is one component of a comprehensive MRSA colonization surveillance program. It is not intended to diagnose MRSA infection nor to guide or monitor treatment for MRSA infections. Performed at [facility], [place] [address], [place], [place] [address]      Studies: No results found.  Scheduled Meds: . atorvastatin  20 mg Oral QHS  . insulin aspart  0-9 Units Subcutaneous TID WC  . ipratropium  0.5 mg Nebulization BID  . levalbuterol  0.63 mg Nebulization BID  . polyethylene glycol  17 g Oral Daily  . senna-docusate  1 tablet Oral BID  . sodium  bicarbonate  650 mg Oral TID  . sodium chloride flush  3 mL Intravenous Q12H  . tamsulosin  0.4 mg Oral Daily  . umeclidinium bromide  1 puff Inhalation Daily    Continuous Infusions: . sodium chloride       Time spent: 68mins,  I have personally reviewed and interpreted on  05/25/2018 daily labs, tele strips, imagings as discussed above under date review session and assessment and plans.  I reviewed all nursing notes, pharmacy notes, consultant notes,  vitals, pertinent old records  I have discussed plan of care as described above with RN , patient  on 05/25/2018   Florencia Reasons MD, PhD  [facility] Pager [phone]. If 7PM-7AM, please contact night-coverage at [email or web address], password [facility] 05/25/2018, 1:07 PM  LOS: 4 days

## 2018-05-26 LAB — BASIC METABOLIC PANEL
Anion gap: 9 (ref 5–15)
BUN: 76 mg/dL — AB (ref 8–23)
CALCIUM: 8.7 mg/dL — AB (ref 8.9–10.3)
CO2: 24 mmol/L (ref 22–32)
Chloride: 112 mmol/L — ABNORMAL HIGH (ref 98–111)
Creatinine, Ser: 2.66 mg/dL — ABNORMAL HIGH (ref 0.61–1.24)
GFR calc Af Amer: 24 mL/min — ABNORMAL LOW (ref >60)
GFR calc non Af Amer: 21 mL/min — ABNORMAL LOW (ref >60)
Glucose, Bld: 249 mg/dL — ABNORMAL HIGH (ref 70–99)
Potassium: 5.1 mmol/L (ref 3.5–5.1)
Sodium: 145 mmol/L (ref 135–145)

## 2018-05-26 LAB — CBC WITH DIFFERENTIAL/PLATELET
Abs Immature Granulocytes: 0.03 10*3/uL (ref 0.00–0.07)
BASOS PCT: 0 %
Basophils Absolute: 0 10*3/uL (ref 0.0–0.1)
EOS ABS: 0.2 10*3/uL (ref 0.0–0.5)
Eosinophils Relative: 3 %
HCT: 30.5 % — ABNORMAL LOW (ref 39.0–52.0)
Hemoglobin: 9 g/dL — ABNORMAL LOW (ref 13.0–17.0)
Immature Granulocytes: 0 %
Lymphocytes Relative: 6 %
Lymphs Abs: 0.4 10*3/uL — ABNORMAL LOW (ref 0.7–4.0)
MCH: 31.7 pg (ref 26.0–34.0)
MCHC: 29.5 g/dL — ABNORMAL LOW (ref 30.0–36.0)
MCV: 107.4 fL — ABNORMAL HIGH (ref 80.0–100.0)
Monocytes Absolute: 0.7 10*3/uL (ref 0.1–1.0)
Monocytes Relative: 10 %
Neutro Abs: 5.5 10*3/uL (ref 1.7–7.7)
Neutrophils Relative %: 81 %
Platelets: 139 10*3/uL — ABNORMAL LOW (ref 150–400)
RBC: 2.84 MIL/uL — ABNORMAL LOW (ref 4.22–5.81)
RDW: 18.2 % — ABNORMAL HIGH (ref 11.5–15.5)
WBC: 6.9 10*3/uL (ref 4.0–10.5)
nRBC: 0 % (ref 0.0–0.2)

## 2018-05-26 LAB — GLUCOSE, CAPILLARY
Glucose-Capillary: 171 mg/dL — ABNORMAL HIGH (ref 70–99)
Glucose-Capillary: 197 mg/dL — ABNORMAL HIGH (ref 70–99)
Glucose-Capillary: 184 mg/dL — ABNORMAL HIGH (ref 70–99)
Glucose-Capillary: 176 mg/dL — ABNORMAL HIGH (ref 70–99)

## 2018-05-26 MED ORDER — IPRATROPIUM BROMIDE 0.02 % IN SOLN
0.5000 mg | Freq: Four times a day (QID) | RESPIRATORY_TRACT | Status: DC
  Administered 2018-05-26 – 2018-05-29 (×15): 0.5 mg via RESPIRATORY_TRACT
  Filled 2018-05-26 (×15): qty 2.5

## 2018-05-26 MED ORDER — LEVALBUTEROL HCL 0.63 MG/3ML IN NEBU: 0.6300 mg | INHALATION_SOLUTION | Freq: Four times a day (QID) | RESPIRATORY_TRACT | Status: DC

## 2018-05-26 MED ORDER — LEVALBUTEROL HCL 0.63 MG/3ML IN NEBU
0.6300 mg | INHALATION_SOLUTION | Freq: Four times a day (QID) | RESPIRATORY_TRACT | Status: DC
  Administered 2018-05-26 – 2018-05-29 (×13): 0.63 mg via RESPIRATORY_TRACT
  Filled 2018-05-26 (×13): qty 3

## 2018-05-26 NOTE — Clinical Social Work Note (Signed)
Clinical Social Work Assessment  Patient Details  Name: Justin Christensen MRN: [id number] Date of Birth: 1930-10-29  Date of referral:  05/26/18               Reason for consult:  Facility Placement                Permission sought to share information with:  Family Supports Permission granted to share information::     Name::     [phone] Daughter [name]  Agency::  [facility]  Relationship::     Contact Information:     Housing/Transportation Living arrangements for the past 2 months:  Single Family Home(but was at [facility] for 20 days end of October through early November) Source of Information:  Adult [place], Other (Comment Required)([facility] Education officer, museum) Patient Interpreter Needed:  None Criminal Activity/Legal Involvement Pertinent to Current Situation/Hospitalization:  No - Comment as needed Significant Relationships:  Adult Children, Other Family Members Lives with:  Adult Children Do you feel safe going back to the place where you live?  Yes Need for family participation in patient care:  Yes (Comment)(daughter)  Care giving concerns:  Pt admitted from home where he resides with his daughter and her family. Pt admitted with hematuria. Pt was in SNF at [facility] a few weeks ago, DCd home after 20 days of rehab. Daughter reports he progressed very well while there. Previous admission was for hypoglycemia.    Social Worker assessment / plan:  CSW consulted to assist with SNF placement. Discussed with pt's daughter (pt confused). Since pt has been to SNF within past 60 days, would be in copay days should he return (specifically interested in returning to [facility] which is $170/day copay). Daughter reports they do not have finances to support this. Pt also is service connected through [place]- spoke with [facility]- she states that pt could be considered for admission to [facility] SNF only if unable to use Medicare benefits. CSW spoke with  admissions/transfer coordination at [place] and made referral for SNF. Was informed [facility] within next week or two but was advised to make referral in case of changes. Will follow to help plan disposition- TBD.  Employment status:  Retired Forensic scientist:  Information systems manager, [employer] Benefit PT Recommendations:  [place] / Referral to community resources:  [place]  Patient/Family's Response to care:  appreciative  Patient/Family's Understanding of and Emotional Response to Diagnosis, Current Treatment, and Prognosis:  Pt not oriented to situation but daughter was good historian of pt's care and previous hospitalizations and was involved and engaged in planning.   Emotional Assessment Appearance:    Attitude/Demeanor/Rapport:    Affect (typically observed):    Orientation:  Oriented to Self Alcohol / Substance use:    Psych involvement (Current and /or in the community):  No (Comment)  Discharge Needs  Concerns to be addressed:    Readmission within the last 30 days:  No Current discharge risk:  Dependent with Mobility Barriers to Discharge:  Continued Medical Work up   [employer], LCSW 05/26/2018, 4:00 PM [phone]

## 2018-05-26 NOTE — Progress Notes (Signed)
PROGRESS NOTE    Justin Christensen  RAX:[id number] DOB: Oct 09, 1930 DOA: 05/21/2018 [facility]: [name]  Brief Narrative: 84 year old with past medical history significant for diabetes, mild thrombocytopenia, COPD, pulmonary hypertension, A. fib, on Eliquis, nephrolithiasis, chronic kidney disease who presents at [facility] with hypoglycemia, thought to be secondary to sulfonylurea use.  Patient received IV fluids, subsequently developed volume overload and received IV Lasix.  Foley was placed to monitor urine output and then patient developed delirium, patient self discontinuing catheter which was traumatic.  Catheter was reinserted and he is been having hematuria.  Had 2 g drop of hemoglobin.  Eliquis has been on hold.  Patient was transferred to [facility] for urology evaluation.   Assessment & Plan:   Active Problems:   Hypoglycemia   CKD (chronic kidney disease), stage V (HCC)   HTN (hypertension)   AF (paroxysmal atrial fibrillation) (HCC)   COPD (chronic obstructive pulmonary disease) (HCC)   Chronic anemia   Thrombocytopenia (HCC)   BPH (benign prostatic hyperplasia)   Urethral trauma   Pulmonary hypertension, moderate to severe (HCC)   AKI (acute kidney injury) ([place])   Acute blood loss anemia   Acute metabolic encephalopathy   Gross hematuria; Secondary to traumatic Foley removed.  The setting of Eliquis. Urology was consulted, per urology on 1215 continue Foley catheter to drain, once urine is clear gel okay to remove Foley catheter and restart Eliquis. Patient continued to have hematuria today urine is darker today.  Repeat hemoglobin in the morning. I asked the registered nurse to contact urology for follow-up.  Anemia of chronic disease, with acute blood loss, from hematuria. His hemoglobin dropped to 6.6 on 12/15.  He received 2 units of packed red blood cell. Hemoglobin has remained stable around 8 since blood transfusion. Recurrent hematuria repeat hemoglobin in the  morning.  Mild thrombocytopenia Improving.  Acute on chronic renal disease, stage V -Cr during hospitalization at [facility] prior to transfer to [facility] has been around 3-4  Mild hyperkalemia, metabolic acidosis. He received a dose of cardiac silhouette, and bicarb  Paroxysmal A. Fib Not on rate control medication Started on Coreg due to elevated blood pressure. Continue to hold Eliquis due to hematuria.  COPD on home oxygen Has persistent bilateral wheezing, will schedule nebulizer every 6 hours.  RN Pressure Injury Documentation:    Malnutrition Type:      Malnutrition Characteristics:      Nutrition Interventions:     Estimated body mass index is 27.44 kg/m as calculated from the following:   Height as of this encounter: 5\' 5"  (1.651 m).   Weight as of this encounter: 74.8 kg.   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 Is mild short of breath.  Objective: Vitals:   05/25/18 2232 05/26/18 0413 05/26/18 1019 05/26/18 1259  BP: (!) 160/81 (!) 166/74  (!) 151/82  Pulse: 79 84  87  Resp: 20 16  19   Temp: 97.8 F (36.6 C) 97.9 F (36.6 C)  98 F (36.7 C)  TempSrc: Oral Oral  Oral  SpO2: 95% 94% 90% 98%  Weight:      Height:        Intake/Output Summary (Last 24 hours) at 05/26/2018 1428 Last data filed at 05/26/2018 1300 Gross  per 24 hour  Intake 934 ml  Output 1825 ml  Net -891 ml   Filed Weights   05/21/18 1906 05/25/18 0559  Weight: 77.9 kg 74.8 kg    Examination:  General exam: Appears calm and comfortable  Respiratory system: Bilateral wheezing Cardiovascular system: S1 & S2 heard, RRR. No JVD, murmurs, rubs, gallops or clicks. No pedal edema. Gastrointestinal system: Abdomen  is nondistended, soft and nontender. No organomegaly or masses felt. Normal bowel sounds heard. Central nervous system: Confused Extremities: Symmetric 5 x 5 power. Skin: No rashes, lesions or ulcers    Data Reviewed: I have personally reviewed following labs and imaging studies  CBC: Recent Labs  Lab 05/21/18 2251 05/23/18 0529 05/23/18 2045 05/24/18 0448 05/25/18 0538 05/26/18 0445  WBC 7.4 6.1  --  6.2 7.1 6.9  NEUTROABS  --   --   --   --   --  5.5  HGB 7.5* 6.6* 8.8* 8.5* 8.8* 9.0*  HCT 25.3*  22.3* 22.0* 28.3* 27.2* 29.0* 30.5*  MCV 110.5* 108.9*  --  103.8* 104.7* 107.4*  PLT 129* 117*  --  120* 146* 144*   Basic Metabolic Panel: Recent Labs  Lab 05/21/18 2251 05/23/18 0529 05/24/18 0448 05/25/18 0538 05/26/18 0445  NA 135 139 141 145 145  K 5.0 5.1 5.3* 4.6 5.1  CL 105 110 111 113* 112*  CO2 20* 20* 21* 24 24  GLUCOSE 178* 138* 158* 217* 249*  BUN 96* 106* 98* 88* 76*  CREATININE 4.44* 4.22* 3.87* 3.26* 2.66*  CALCIUM 8.3* 8.4* 8.5* 8.6* 8.7*   GFR: Estimated Creatinine Clearance: 19.2 mL/min (A) (by C-G formula based on SCr of 2.66 mg/dL (H)). Liver Function Tests: Recent Labs  Lab 05/21/18 2251 05/23/18 0529  AST 27 19  ALT 34 25  ALKPHOS 86 76  BILITOT 0.8 0.7  PROT 6.2* 5.5*  ALBUMIN 3.2* 3.1*   No results for input(s): LIPASE, AMYLASE in the last 168 hours. Recent Labs  Lab 05/23/18 0529  AMMONIA 35   Coagulation Profile: No results for input(s): INR, PROTIME in the last 168 hours. Cardiac Enzymes: Recent Labs  Lab 05/24/18 0448  CKTOTAL 29*   BNP (last 3 results) No results for input(s): PROBNP in the last 8760 hours. HbA1C: No results for input(s): HGBA1C in the last 72 hours. CBG: Recent Labs  Lab 05/25/18 0734 05/25/18 1126 05/25/18 1609 05/25/18 2234 05/26/18 1117  GLUCAP 170* 175* 173* 137* 197*   Lipid Profile: Recent Labs    05/24/18 0448  CHOL 56  HDL 15*  LDLCALC 31  TRIG 50  CHOLHDL 3.7   Thyroid  Function Tests: Recent Labs    05/24/18 0448  TSH 2.705   Anemia Panel: No results for input(s): VITAMINB12, FOLATE, FERRITIN, TIBC, IRON, RETICCTPCT in the last 72 hours. Sepsis Labs: No results for input(s): PROCALCITON, LATICACIDVEN in the last 168 hours.  Recent Results (from the past 240 hour(s))  MRSA PCR Screening     Status: None   Collection Time: 05/25/18  8:53 AM  Result Value Ref Range Status   MRSA by PCR NEGATIVE NEGATIVE Final    Comment:        The GeneXpert MRSA Assay (FDA approved for NASAL specimens only), is one component of a comprehensive MRSA colonization surveillance program. It is not intended to diagnose MRSA infection nor to guide or monitor treatment for MRSA infections. Performed at [facility], [place] [address] [place], [place] [address]  Radiology Studies: No results found.      Scheduled Meds: . atorvastatin  20 mg Oral QHS  . carvedilol  3.125 mg Oral BID WC  . insulin aspart  0-9 Units Subcutaneous TID WC  . ipratropium  0.5 mg Nebulization Q6H  . levalbuterol  0.63 mg Nebulization Q6H  . polyethylene glycol  17 g Oral Daily  . senna-docusate  1 tablet Oral BID  . sodium chloride flush  3 mL Intravenous Q12H  . tamsulosin  0.4 mg Oral Daily  . umeclidinium bromide  1 puff Inhalation Daily   Continuous Infusions: . sodium chloride       LOS: 5 days    Time spent: 35 minutes.     Elmarie Shiley, MD [facility] Pager [phone]  If 7PM-7AM, please contact night-coverage [email or web address] Password [email or web address] 05/26/2018, 2:28 PM

## 2018-05-26 NOTE — NC FL2 (Signed)
[place] LEVEL OF CARE SCREENING TOOL     IDENTIFICATION  Patient Name: Halil Rentz Minner Birthdate: 08/10/1930 Sex: male Admission Date (Current Location): 05/21/2018  [facility] and [place] Number:  Herbalist and Address:  [facility],  [place] [address] [place]      Provider Number: [id number]  Attending Physician Name and Address:  Elmarie Shiley, MD  Relative Name and Phone Number:       Current Level of Care: Hospital Recommended Level of Care: [place] Prior Approval Number:    Date Approved/Denied:   PASRR Number: [id number]  Discharge Plan: SNF    Current Diagnoses: Patient Active Problem List   Diagnosis Date Noted  . AKI (acute kidney injury) ([place])   . Acute blood loss anemia   . Acute metabolic encephalopathy   . Urethral trauma 05/21/2018  . Pulmonary hypertension, moderate to severe ([place]) 05/21/2018  . Hypoglycemia 03/17/2018  . CKD (chronic kidney disease), stage V ([place]) 03/17/2018  . HTN (hypertension) 03/17/2018  . HLD (hyperlipidemia) 03/17/2018  . AF (paroxysmal atrial fibrillation) ([place]) 03/17/2018  . COPD (chronic obstructive pulmonary disease) ([place]) 03/17/2018  . CHF (congestive heart failure) ([place]) 03/17/2018  . Chronic anemia 03/17/2018  . Thrombocytopenia ([place]) 03/17/2018  . BPH (benign prostatic hyperplasia) 03/17/2018  . Depression 03/17/2018  . Chronic pain 03/17/2018    Orientation RESPIRATION BLADDER Height & Weight     Self  Normal Indwelling catheter Weight: 164 lb 14.5 oz (74.8 kg) Height:  5\' 5"  (165.1 cm)  BEHAVIORAL SYMPTOMS/MOOD NEUROLOGICAL BOWEL NUTRITION STATUS      Continent Diet(carb modified)  AMBULATORY STATUS COMMUNICATION OF NEEDS Skin   Extensive Assist Verbally Normal                       Personal Care Assistance Level of Assistance  Bathing, Feeding, Dressing Bathing Assistance: Maximum assistance Feeding assistance:  Independent Dressing Assistance: Limited assistance     Functional Limitations Info  Sight, Hearing, Speech Sight Info: Adequate Hearing Info: Adequate Speech Info: Adequate    SPECIAL CARE FACTORS FREQUENCY  PT (By licensed PT), OT (By licensed OT)     PT Frequency: 5x OT Frequency: 5x            Contractures Contractures Info: Not present    Additional Factors Info  Code Status, Allergies Code Status Info: Full code Allergies Info: snka           Current Medications (05/26/2018):  This is the current hospital active medication list Current Facility-Administered Medications  Medication Dose Route Frequency Provider Last Rate Last Dose  . 0.9 %  sodium chloride infusion  250 mL Intravenous PRN Wouk, Ailene Rud, MD      . atorvastatin (LIPITOR) tablet 20 mg  20 mg Oral QHS Florencia Reasons, MD   20 mg at 05/25/18 2200  . carvedilol (COREG) tablet 3.125 mg  3.125 mg Oral BID WC Florencia Reasons, MD      . insulin aspart (novoLOG) injection 0-9 Units  0-9 Units Subcutaneous TID WC Wouk, Ailene Rud, MD   2 Units at 05/25/18 1656  . ipratropium (ATROVENT) nebulizer solution 0.5 mg  0.5 mg Nebulization Q6H Regalado, Belkys A, MD   0.5 mg at 05/26/18 1019  . levalbuterol (XOPENEX) nebulizer solution 0.63 mg  0.63 mg Nebulization Q6H Regalado, Belkys A, MD   0.63 mg at 05/26/18 1019  . ondansetron (ZOFRAN) injection 4 mg  4 mg  Intravenous Q6H PRN Schorr, Rhetta Mura, NP   4 mg at 05/25/18 2159  . polyethylene glycol (MIRALAX / GLYCOLAX) packet 17 g  17 g Oral Daily Florencia Reasons, MD   17 g at 05/25/18 1405  . senna-docusate (Senokot-S) tablet 1 tablet  1 tablet Oral BID Florencia Reasons, MD   1 tablet at 05/25/18 2200  . sodium chloride flush (NS) 0.9 % injection 3 mL  3 mL Intravenous Q12H Wouk, Ailene Rud, MD   3 mL at 05/25/18 2200  . sodium chloride flush (NS) 0.9 % injection 3 mL  3 mL Intravenous PRN Gwynne Edinger, MD   3 mL at 05/24/18 2115  . tamsulosin (FLOMAX) capsule 0.4 mg  0.4 mg  Oral Daily Florencia Reasons, MD   0.4 mg at 05/25/18 0841  . umeclidinium bromide (INCRUSE ELLIPTA) 62.5 MCG/INH 1 puff  1 puff Inhalation Daily Lenis Noon, RPH   1 puff at 05/26/18 1019     Discharge Medications: Please see discharge summary for a list of discharge medications.  Relevant Imaging Results:  Relevant Lab Results:   Additional Information SSN- [id number]  Nila Nephew, LCSW

## 2018-05-27 LAB — BASIC METABOLIC PANEL
Anion gap: 9 (ref 5–15)
BUN: 62 mg/dL — ABNORMAL HIGH (ref 8–23)
CALCIUM: 9.1 mg/dL (ref 8.9–10.3)
CO2: 27 mmol/L (ref 22–32)
Chloride: 109 mmol/L (ref 98–111)
Creatinine, Ser: 2.35 mg/dL — ABNORMAL HIGH (ref 0.61–1.24)
GFR calc Af Amer: 28 mL/min — ABNORMAL LOW (ref >60)
GFR calc non Af Amer: 24 mL/min — ABNORMAL LOW (ref >60)
Glucose, Bld: 158 mg/dL — ABNORMAL HIGH (ref 70–99)
Potassium: 5.5 mmol/L — ABNORMAL HIGH (ref 3.5–5.1)
Sodium: 145 mmol/L (ref 135–145)

## 2018-05-27 LAB — GLUCOSE, CAPILLARY
Glucose-Capillary: 221 mg/dL — ABNORMAL HIGH (ref 70–99)
Glucose-Capillary: 156 mg/dL — ABNORMAL HIGH (ref 70–99)
Glucose-Capillary: 219 mg/dL — ABNORMAL HIGH (ref 70–99)

## 2018-05-27 LAB — CBC
HCT: 33.2 % — ABNORMAL LOW (ref 39.0–52.0)
Hemoglobin: 9.6 g/dL — ABNORMAL LOW (ref 13.0–17.0)
MCH: 31.9 pg (ref 26.0–34.0)
MCHC: 28.9 g/dL — ABNORMAL LOW (ref 30.0–36.0)
MCV: 110.3 fL — ABNORMAL HIGH (ref 80.0–100.0)
Platelets: 143 10*3/uL — ABNORMAL LOW (ref 150–400)
RBC: 3.01 MIL/uL — ABNORMAL LOW (ref 4.22–5.81)
RDW: 17.2 % — ABNORMAL HIGH (ref 11.5–15.5)
WBC: 7 10*3/uL (ref 4.0–10.5)
nRBC: 0 % (ref 0.0–0.2)

## 2018-05-27 MED ORDER — METHYLPREDNISOLONE SODIUM SUCC 40 MG IJ SOLR
40.0000 mg | Freq: Two times a day (BID) | INTRAMUSCULAR | Status: DC
  Administered 2018-05-27 – 2018-05-28 (×4): 40 mg via INTRAVENOUS
  Filled 2018-05-27 (×3): qty 1

## 2018-05-27 MED ORDER — INSULIN ASPART 100 UNIT/ML ~~LOC~~ SOLN
4.0000 [IU] | Freq: Once | SUBCUTANEOUS | Status: AC
  Administered 2018-05-27: 4 [IU] via SUBCUTANEOUS

## 2018-05-27 MED ORDER — SODIUM POLYSTYRENE SULFONATE 15 GM/60ML PO SUSP
30.0000 g | Freq: Once | ORAL | Status: AC
  Administered 2018-05-27: 30 g via ORAL
  Filled 2018-05-27: qty 120

## 2018-05-27 NOTE — Progress Notes (Signed)
CSW referred pt to [facility] SNF today- was advised by admissions that referral was reviewed and appropriate for rehab however [facility] does not expect any bed availability in coming weeks. Offered 32 day contract to cover SNF at [facility]- Will update pt's daughter and referral to those local SNFs.  Sharren Bridge, MSW, LCSW Clinical Social Work 05/27/2018 [phone]

## 2018-05-27 NOTE — Care Management Important Message (Signed)
Important Message  Patient Details  Name: Justin Christensen MRN: [id number] Date of Birth: Dec 16, 1930   Medicare Important Message Given:  Yes    Kerin Salen 05/27/2018, 12:17 [place] Message  Patient Details  Name: Justin Christensen MRN: [id number] Date of Birth: 17-Aug-1930   Medicare Important Message Given:  Yes    Kerin Salen 05/27/2018, 12:17 PM

## 2018-05-27 NOTE — Progress Notes (Signed)
PROGRESS NOTE    Justin Christensen  BOF:[id number] DOB: February 05, 1931 DOA: 05/21/2018 PCP: [name]  Brief Narrative: 84 year old with past medical history significant for diabetes, mild thrombocytopenia, COPD, pulmonary hypertension, A. fib, on Eliquis, nephrolithiasis, chronic kidney disease who presents [facility] with hypoglycemia, thought to be secondary to sulfonylurea use.  Patient received IV fluids, subsequently developed volume overload and received IV Lasix.  Foley was placed to monitor urine output and then patient developed delirium, patient self discontinuing catheter which was traumatic.  Catheter was reinserted and he is been having hematuria.  Had 2 g drop of hemoglobin.  Eliquis has been on hold.  Patient was transferred to [facility] long for urology evaluation.   Assessment & Plan:   Active Problems:   Hypoglycemia   CKD (chronic kidney disease), stage V (HCC)   HTN (hypertension)   AF (paroxysmal atrial fibrillation) (HCC)   COPD (chronic obstructive pulmonary disease) (HCC)   Chronic anemia   Thrombocytopenia (HCC)   BPH (benign prostatic hyperplasia)   Urethral trauma   Pulmonary hypertension, moderate to severe (HCC)   AKI (acute kidney injury) ([place])   Acute blood loss anemia   Acute metabolic encephalopathy   Gross hematuria; Secondary to traumatic Foley removed.  The setting of Eliquis. Urology was consulted, per urology on 1215 continue Foley catheter to drain, once urine is clear gel okay to remove Foley catheter and restart Eliquis. Patient continued to have hematuria today urine is darker today.  Repeat hemoglobin in the morning. Hb stable.  Urine clear. Remove catheter today. If urine clear tomorrow will resume anticoagulation.   Anemia of chronic disease, with acute blood loss, from hematuria. His hemoglobin dropped to 6.6 on 12/15.  He received 2 units of packed red blood cell. Hemoglobin has remained stable around 8 since blood transfusion. Hb  stable.    Mild thrombocytopenia Improving.  Acute on chronic renal disease, stage V -Cr during hospitalization at [facility] prior to transfer to [place] has been around 3-4 Stable.   Mild hyperkalemia, metabolic acidosis. He received a dose of cardiac silhouette, and bicarb Repeat dose of kayexalate and change diet to renal diet.   Paroxysmal A. Fib Not on rate control medication Started on Coreg due to elevated blood pressure. Continue to hold Eliquis due to hematuria.  Acute COPD exacerbation  on home oxygen Has persistent bilateral wheezing, will schedule nebulizer every 6 hours. Start solumedrol.   RN Pressure Injury Documentation:    Malnutrition Type:      Malnutrition Characteristics:      Nutrition Interventions:     Estimated body mass index is 27.44 kg/m as calculated from the following:   Height as of this encounter: 5\' 5"  (1.651 m).   Weight as of this encounter: 74.8 kg.   DVT prophylaxis: SCDs Code Status: (Full code Family Communication: Care discussed with patient Disposition Plan: Will likely need SNF  Consultants:   Urology  Procedures:  None Antimicrobials: None  Subjective: Confuse.  Denies dyspnea.  Still with Bilateral wheezing.  Objective: Vitals:   05/27/18 0611 05/27/18 0751 05/27/18 0757 05/27/18 1254  BP: (!) 159/83   (!) 166/97  Pulse: 98   (!) 107  Resp: 16   18  Temp: 97.8 F (36.6 C)   98.3 F (36.8 C)  TempSrc: Oral   Oral  SpO2: 93% 96% 96% 96%  Weight:      Height:        Intake/Output Summary (Last 24 hours) at  05/27/2018 1552 Last data filed at 05/27/2018 1256 Gross per 24 hour  Intake 560 ml  Output 1800 ml  Net -1240 ml   Filed Weights   05/21/18 1906 05/25/18 0559  Weight: 77.9 kg 74.8 kg    Examination:  General exam: NAD Respiratory system: bilateral wheezing Cardiovascular system: S 1, S  =2 RRR Gastrointestinal system: bs present, soft, nt Central nervous system:  confuse Extremities no edema   Data Reviewed: I have personally reviewed following labs and imaging studies  CBC: Recent Labs  Lab 05/23/18 0529 05/23/18 2045 05/24/18 0448 05/25/18 0538 05/26/18 0445 05/27/18 0409  WBC 6.1  --  6.2 7.1 6.9 7.0  NEUTROABS  --   --   --   --  5.5  --   HGB 6.6* 8.8* 8.5* 8.8* 9.0* 9.6*  HCT 22.0* 28.3* 27.2* 29.0* 30.5* 33.2*  MCV 108.9*  --  103.8* 104.7* 107.4* 110.3*  PLT 117*  --  120* 146* 139* 163*   Basic Metabolic Panel: Recent Labs  Lab 05/23/18 0529 05/24/18 0448 05/25/18 0538 05/26/18 0445 05/27/18 0409  NA 139 141 145 145 145  K 5.1 5.3* 4.6 5.1 5.5*  CL 110 111 113* 112* 109  CO2 20* 21* 24 24 27   GLUCOSE 138* 158* 217* 249* 158*  BUN 106* 98* 88* 76* 62*  CREATININE 4.22* 3.87* 3.26* 2.66* 2.35*  CALCIUM 8.4* 8.5* 8.6* 8.7* 9.1   GFR: Estimated Creatinine Clearance: 21.7 mL/min (A) (by C-G formula based on SCr of 2.35 mg/dL (H)). Liver Function Tests: Recent Labs  Lab 05/21/18 2251 05/23/18 0529  AST 27 19  ALT 34 25  ALKPHOS 86 76  BILITOT 0.8 0.7  PROT 6.2* 5.5*  ALBUMIN 3.2* 3.1*   No results for input(s): LIPASE, AMYLASE in the last 168 hours. Recent Labs  Lab 05/23/18 0529  AMMONIA 35   Coagulation Profile: No results for input(s): INR, PROTIME in the last 168 hours. Cardiac Enzymes: Recent Labs  Lab 05/24/18 0448  CKTOTAL 29*   BNP (last 3 results) No results for input(s): PROBNP in the last 8760 hours. HbA1C: No results for input(s): HGBA1C in the last 72 hours. CBG: Recent Labs  Lab 05/26/18 0742 05/26/18 1117 05/26/18 1632 05/26/18 2236 05/27/18 1109  GLUCAP 171* 197* 184* 176* 221*   Lipid Profile: No results for input(s): CHOL, HDL, LDLCALC, TRIG, CHOLHDL, LDLDIRECT in the last 72 hours. Thyroid Function Tests: No results for input(s): TSH, T4TOTAL, FREET4, T3FREE, THYROIDAB in the last 72 hours. Anemia Panel: No results for input(s): VITAMINB12, FOLATE, FERRITIN, TIBC,  IRON, RETICCTPCT in the last 72 hours. Sepsis Labs: No results for input(s): PROCALCITON, LATICACIDVEN in the last 168 hours.  Recent Results (from the past 240 hour(s))  MRSA PCR Screening     Status: None   Collection Time: 05/25/18  8:53 AM  Result Value Ref Range Status   MRSA by PCR NEGATIVE NEGATIVE Final    Comment:        The GeneXpert MRSA Assay (FDA approved for NASAL specimens only), is one component of a comprehensive MRSA colonization surveillance program. It is not intended to diagnose MRSA infection nor to guide or monitor treatment for MRSA infections. Performed at [facility], [place] [address], [place], [place] [address]          Radiology Studies: No results found.      Scheduled Meds: . atorvastatin  20 mg Oral QHS  . carvedilol  3.125 mg Oral BID WC  .  insulin aspart  0-9 Units Subcutaneous TID WC  . ipratropium  0.5 mg Nebulization Q6H  . levalbuterol  0.63 mg Nebulization Q6H  . methylPREDNISolone (SOLU-MEDROL) injection  40 mg Intravenous BID  . polyethylene glycol  17 g Oral Daily  . senna-docusate  1 tablet Oral BID  . sodium chloride flush  3 mL Intravenous Q12H  . tamsulosin  0.4 mg Oral Daily  . umeclidinium bromide  1 puff Inhalation Daily   Continuous Infusions: . sodium chloride       LOS: 6 days    Time spent: 35 minutes.     Elmarie Shiley, MD [facility] Pager [phone]  If 7PM-7AM, please contact night-coverage [email or web address] Password [email or web address] 05/27/2018, 3:52 PM

## 2018-05-27 NOTE — Progress Notes (Signed)
Pt is active at [facility], PCP is Dr. Joetta Manners, [facility] [phone] pager, office [phone] ext [phone]. If pt is going to SNF will need paperwork completed and faxed into the [place] by 09:00 AM/05/28/2018 to the [place]. [facility] forms are on pt's chart.

## 2018-05-27 NOTE — Progress Notes (Signed)
Upon assessment of pt, foley catheter was leaking at site. Small clots were observed and a pad saturated with 1-2oz of blood. Pt denied pulling at the catheter. Urine output >30hr, and urine yellow and clear in bag. Pt cleaned and no other blood observed. Will continue to monitor.

## 2018-05-27 NOTE — Progress Notes (Addendum)
Physical Therapy Treatment Patient Details Name: Justin Christensen MRN: [id number] DOB: 08-Apr-1931 Today's Date: 05/27/2018    History of Present Illness 84 y.o. male admitted with hypoglycemia, confusion, and hematuria. PMH significant for DM, mild thrombocytopenia, HTN, COPD, pulmonary HTN, atrial fibrillation on eliquis, nephrolithiasis, CKD and anemia.    PT Comments    Progressing very slowly with mobility. Pt remains confused. He repeatedly stated "I gotta get out of here." No family present at bedside during session. He was able to tolerate a little more activity on today. He required less assist however he remains weak and at risk for falls. Continue to recommend SNF for rehab.     Follow Up Recommendations  SNF     Equipment Recommendations  Rolling walker with 5" wheels    Recommendations for Other Services       Precautions / Restrictions Precautions Precautions: Fall Restrictions Weight Bearing Restrictions: No    Mobility  Bed Mobility Overal bed mobility: Needs Assistance Bed Mobility: Supine to Sit     Supine to sit: Min assist     General bed mobility comments: Increased time. Cues for safety, technique. Utilized bedpad to aid with scooting.   Transfers Overall transfer level: Needs assistance Equipment used: Rolling walker (2 wheeled) Transfers: Sit to/from [facility] Sit to Stand: Min assist Stand pivot transfers: Min assist       General transfer comment: Increased time. Assist to rise, stabilize, control descent. VCs safety, technique, hand placement. Stand pivot, bed to recliner, with RW.   Ambulation/Gait Ambulation/Gait assistance: Min assist; +2 safety/equipment Gait Distance (Feet): 3 Feet Assistive device: Rolling walker (2 wheeled) Gait Pattern/deviations: Step-to pattern;Antalgic;Trunk flexed     General Gait Details: Pt took a few steps in room with a RW. Distance was limited by bil Knee pain and fatigue.     Stairs             Wheelchair Mobility    Modified Rankin (Stroke Patients Only)       Balance Overall balance assessment: Needs assistance         Standing balance support: Bilateral upper extremity supported Standing balance-Leahy Scale: Poor                              Cognition Arousal/Alertness: Awake/alert Behavior During Therapy: WFL for tasks assessed/performed Overall Cognitive Status: Impaired/Different from baseline Area of Impairment: Orientation;Attention;Memory                 Orientation Level: Disoriented to;Place;Time;Situation   Memory: Decreased short-term memory;Decreased recall of precautions                Exercises      General Comments        Pertinent Vitals/Pain Pain Assessment: Faces Faces Pain Scale: Hurts even more Pain Location: knees and back Pain Descriptors / Indicators: Aching;Sore;Sharp Pain Intervention(s): Limited activity within patient's tolerance;Repositioned    Home Living                      Prior Function            PT Goals (current goals can now be found in the care plan section) Progress towards PT goals: Progressing toward goals    Frequency    Min 2X/week      PT Plan Current plan remains appropriate    Co-evaluation  AM-PAC PT "6 Clicks" Mobility   Outcome Measure  Help needed turning from your back to your side while in a flat bed without using bedrails?: A Lot Help needed moving from lying on your back to sitting on the side of a flat bed without using bedrails?: A Lot Help needed moving to and from a bed to a chair (including a wheelchair)?: A Lot Help needed standing up from a chair using your arms (e.g., wheelchair or bedside chair)?: A Lot Help needed to walk in hospital room?: A Lot Help needed climbing 3-5 steps with a railing? : Total 6 Click Score: 11    End of Session Equipment Utilized During Treatment: Gait  belt;Oxygen Activity Tolerance: Patient limited by fatigue;Patient limited by pain Patient left: in chair;with call bell/phone within reach;with chair alarm set Nurse Communication: made RN aware that pt was having some bleeding (blood on bedpad and blood drops on floor once standing)  PT Visit Diagnosis: Muscle weakness (generalized) (M62.81);Other abnormalities of gait and mobility (R26.89);Difficulty in walking, not elsewhere classified (R26.2)     Time: 2336-1224 PT Time Calculation (min) (ACUTE ONLY): 13 min  Charges:  $Therapeutic Activity: 8-22 mins                        Weston Anna, PT Acute Rehabilitation Services Pager: [phone] Office: [phone]

## 2018-05-28 LAB — BASIC METABOLIC PANEL
Anion gap: 8 (ref 5–15)
BUN: 57 mg/dL — ABNORMAL HIGH (ref 8–23)
CO2: 26 mmol/L (ref 22–32)
Calcium: 9 mg/dL (ref 8.9–10.3)
Chloride: 109 mmol/L (ref 98–111)
Creatinine, Ser: 2.12 mg/dL — ABNORMAL HIGH (ref 0.61–1.24)
GFR calc Af Amer: 32 mL/min — ABNORMAL LOW (ref >60)
GFR calc non Af Amer: 28 mL/min — ABNORMAL LOW (ref >60)
Glucose, Bld: 140 mg/dL — ABNORMAL HIGH (ref 70–99)
Potassium: 5.3 mmol/L — ABNORMAL HIGH (ref 3.5–5.1)
Sodium: 143 mmol/L (ref 135–145)

## 2018-05-28 LAB — GLUCOSE, CAPILLARY
GLUCOSE-CAPILLARY: 166 mg/dL — AB (ref 70–99)
Glucose-Capillary: 259 mg/dL — ABNORMAL HIGH (ref 70–99)
Glucose-Capillary: 347 mg/dL — ABNORMAL HIGH (ref 70–99)
Glucose-Capillary: 203 mg/dL — ABNORMAL HIGH (ref 70–99)

## 2018-05-28 LAB — CBC
HCT: 32.3 % — ABNORMAL LOW (ref 39.0–52.0)
Hemoglobin: 9.4 g/dL — ABNORMAL LOW (ref 13.0–17.0)
MCH: 31.5 pg (ref 26.0–34.0)
MCHC: 29.1 g/dL — ABNORMAL LOW (ref 30.0–36.0)
MCV: 108.4 fL — ABNORMAL HIGH (ref 80.0–100.0)
Platelets: 140 10*3/uL — ABNORMAL LOW (ref 150–400)
RBC: 2.98 MIL/uL — ABNORMAL LOW (ref 4.22–5.81)
RDW: 16.6 % — ABNORMAL HIGH (ref 11.5–15.5)
WBC: 7 10*3/uL (ref 4.0–10.5)
nRBC: 0 % (ref 0.0–0.2)

## 2018-05-28 MED ORDER — APIXABAN 2.5 MG PO TABS
2.5000 mg | ORAL_TABLET | Freq: Two times a day (BID) | ORAL | Status: DC
  Administered 2018-05-28 – 2018-06-10 (×26): 2.5 mg via ORAL
  Filled 2018-05-28 (×27): qty 1

## 2018-05-28 MED ORDER — HYDRALAZINE HCL 25 MG PO TABS
25.0000 mg | ORAL_TABLET | Freq: Two times a day (BID) | ORAL | Status: DC
  Administered 2018-05-28: 25 mg via ORAL
  Filled 2018-05-28 (×2): qty 1

## 2018-05-28 MED ORDER — SODIUM ZIRCONIUM CYCLOSILICATE 5 G PO PACK
5.0000 g | PACK | Freq: Once | ORAL | Status: AC
  Administered 2018-05-28: 5 g via ORAL
  Filled 2018-05-28: qty 1

## 2018-05-28 MED ORDER — FUROSEMIDE 40 MG PO TABS
40.0000 mg | ORAL_TABLET | Freq: Every day | ORAL | Status: DC
  Administered 2018-05-28 – 2018-06-05 (×9): 40 mg via ORAL
  Filled 2018-05-28 (×9): qty 1

## 2018-05-28 NOTE — Progress Notes (Signed)
ANTICOAGULATION CONSULT NOTE - Initial Consult  Pharmacy Consult for Eliquis Indication: atrial fibrillation  No Known Allergies  Patient Measurements: Height: 5\' 5"  (165.1 cm) Weight: 164 lb 14.5 oz (74.8 kg) IBW/kg (Calculated) : 61.5  Vital Signs: Temp: 98.6 F (37 C) (12/20 0416) Temp Source: Oral (12/20 0416) BP: 168/95 (12/20 0416) Pulse Rate: 81 (12/20 0416)  Labs: Recent Labs    05/26/18 0445 05/27/18 0409 05/28/18 0405  HGB 9.0* 9.6* 9.4*  HCT 30.5* 33.2* 32.3*  PLT 139* 143* 140*  CREATININE 2.66* 2.35* 2.12*    Estimated Creatinine Clearance: 24.1 mL/min (A) (by C-G formula based on SCr of 2.12 mg/dL (H)).   Medical History: History reviewed. No pertinent past medical history.  Medications:  Scheduled:  . atorvastatin  20 mg Oral QHS  . carvedilol  3.125 mg Oral BID WC  . insulin aspart  0-9 Units Subcutaneous TID WC  . ipratropium  0.5 mg Nebulization Q6H  . levalbuterol  0.63 mg Nebulization Q6H  . methylPREDNISolone (SOLU-MEDROL) injection  40 mg Intravenous BID  . polyethylene glycol  17 g Oral Daily  . senna-docusate  1 tablet Oral BID  . sodium chloride flush  3 mL Intravenous Q12H  . tamsulosin  0.4 mg Oral Daily  . umeclidinium bromide  1 puff Inhalation Daily   Infusions:  . sodium chloride     PRN: sodium chloride, ondansetron (ZOFRAN) IV, sodium chloride flush  Assessment: 84 yo male on chronic Eliquis for afib admitted with hypoglycemia.  Eliquis held on admission for hematuria secondary to traumatic foley.  Patient was transfused 2 units PRBC on 12/15, since then Hgb has remained stable.  Pharmacy consulted to resume Eliquis today 12/20.   Hgb 9.4  Plts 140k  SCr improved 2.12, CrCl 27 ml/min using total body weight  Urine reported to be yellow/clear without clots  Goal of Therapy:  Therapeutic anticoagulation Prevention of VTE, stroke   Plan:   Resume Eliquis 2.5mg  po BID  No dose adjustments needed, Pharmacy will  sign off  Peggyann Juba, PharmD, BCPS Pager: [phone] 05/28/2018,12:56 PM

## 2018-05-28 NOTE — Progress Notes (Addendum)
CSW followed up with SNF's. The patient has two bed offers at [facility] [place]. Per SNF's, the [place] social worker Ms. Loftin will need to submit contract paperwork for the patient to admit. CSW reached out to [employer] x3, waiting on return call.  CSW updated patient and his daughters.   Kathrin Greathouse, Marlinda Mike, MSW Clinical Social Worker  [phone] 05/28/2018  3:11 PM

## 2018-05-28 NOTE — Progress Notes (Addendum)
PROGRESS NOTE    Justin Christensen  KNL:[id number] DOB: 1930/06/25 DOA: 05/21/2018 [facility]: [name]  Brief Narrative: 84 year old with past medical history significant for diabetes, mild thrombocytopenia, COPD, pulmonary hypertension, A. fib, on Eliquis, nephrolithiasis, chronic kidney disease who [facility] with hypoglycemia, thought to be secondary to sulfonylurea use.  Patient received IV fluids, subsequently developed volume overload and received IV Lasix.  Foley was placed to monitor urine output and then patient developed delirium, patient self discontinuing catheter which was traumatic.  Catheter was reinserted and he is been having hematuria.  Had 2 g drop of hemoglobin.  Eliquis has been on hold.  Patient was transferred to [facility] long for urology evaluation.   Assessment & Plan:   Active Problems:   Hypoglycemia   CKD (chronic kidney disease), stage V (HCC)   HTN (hypertension)   AF (paroxysmal atrial fibrillation) (HCC)   COPD (chronic obstructive pulmonary disease) (HCC)   Chronic anemia   Thrombocytopenia (HCC)   BPH (benign prostatic hyperplasia)   Urethral trauma   Pulmonary hypertension, moderate to severe (HCC)   AKI (acute kidney injury) ([place])   Acute blood loss anemia   Acute metabolic encephalopathy   Gross hematuria; Secondary to traumatic Foley removed.  The setting of Eliquis. Urology was consulted, per urology on 1215 continue Foley catheter to drain, once urine is clear gel okay to remove Foley catheter and restart Eliquis. Patient continued to have hematuria today urine is darker today.  Repeat hemoglobin in the morning. Hb stable.  Hematuria resolved. catheter removed. Resume eliquis today.   Anemia of chronic disease, with acute blood loss, from hematuria. His hemoglobin dropped to 6.6 on 12/15.  He received 2 units of packed red blood cell. Hemoglobin has remained stable around 8 since blood transfusion. Hb stable.   HTN; resume  hydralazine.   Mild thrombocytopenia Improving.  Acute on chronic renal disease, stage V -Cr during hospitalization at [facility] prior to transfer to [place] has been around 3-4 Stable.  Resume low dose lasix.   Mild hyperkalemia, metabolic acidosis. He received a dose of cardiac silhouette, and bicarb Lokelma dose.  Resume lasix.   Paroxysmal A. Fib Not on rate control medication Started on Coreg due to elevated blood pressure. Continue to hold Eliquis due to hematuria.  Acute COPD exacerbation  on home oxygen Has persistent bilateral wheezing, will schedule nebulizer every 6 hours. Continue with  solumedrol.   RN Pressure Injury Documentation:    Malnutrition Type:      Malnutrition Characteristics:      Nutrition Interventions:     Estimated body mass index is 27.44 kg/m as calculated from the following:   Height as of this encounter: 5\' 5"  (1.651 m).   Weight as of this encounter: 74.8 kg.   DVT prophylaxis: SCDs Code Status: (Full code Family Communication: Care discussed with patient Disposition Plan: Will likely need SNF  Consultants:   Urology  Procedures:  None Antimicrobials: None  Subjective: Confuse, wants to be discharge.  Report dyspnea.  Still having bilateral; wheezing.   Objective: Vitals:   05/28/18 0416 05/28/18 0907 05/28/18 0911 05/28/18 1316  BP: (!) 168/95   (!) 167/85  Pulse: 81   89  Resp: 20   16  Temp: 98.6 F (37 C)   98.1 F (36.7 C)  TempSrc: Oral   Oral  SpO2: 96% 97% 97% 94%  Weight:      Height:        Intake/Output  Summary (Last 24 hours) at 05/28/2018 1446 Last data filed at 05/28/2018 1317 Gross per 24 hour  Intake 840 ml  Output 875 ml  Net -35 ml   Filed Weights   05/21/18 1906 05/25/18 0559  Weight: 77.9 kg 74.8 kg    Examination:  General exam: NAD Respiratory system: Bilateral wheezing.  Cardiovascular system: S 1, S 2 RRR Gastrointestinal system: BS present, soft,  nt Central nervous system: confuse.  Extremities no edema   Data Reviewed: I have personally reviewed following labs and imaging studies  CBC: Recent Labs  Lab 05/24/18 0448 05/25/18 0538 05/26/18 0445 05/27/18 0409 05/28/18 0405  WBC 6.2 7.1 6.9 7.0 7.0  NEUTROABS  --   --  5.5  --   --   HGB 8.5* 8.8* 9.0* 9.6* 9.4*  HCT 27.2* 29.0* 30.5* 33.2* 32.3*  MCV 103.8* 104.7* 107.4* 110.3* 108.4*  PLT 120* 146* 139* 143* 546*   Basic Metabolic Panel: Recent Labs  Lab 05/24/18 0448 05/25/18 0538 05/26/18 0445 05/27/18 0409 05/28/18 0405  NA 141 145 145 145 143  K 5.3* 4.6 5.1 5.5* 5.3*  CL 111 113* 112* 109 109  CO2 21* 24 24 27 26   GLUCOSE 158* 217* 249* 158* 140*  BUN 98* 88* 76* 62* 57*  CREATININE 3.87* 3.26* 2.66* 2.35* 2.12*  CALCIUM 8.5* 8.6* 8.7* 9.1 9.0   GFR: Estimated Creatinine Clearance: 24.1 mL/min (A) (by C-G formula based on SCr of 2.12 mg/dL (H)). Liver Function Tests: Recent Labs  Lab 05/21/18 2251 05/23/18 0529  AST 27 19  ALT 34 25  ALKPHOS 86 76  BILITOT 0.8 0.7  PROT 6.2* 5.5*  ALBUMIN 3.2* 3.1*   No results for input(s): LIPASE, AMYLASE in the last 168 hours. Recent Labs  Lab 05/23/18 0529  AMMONIA 35   Coagulation Profile: No results for input(s): INR, PROTIME in the last 168 hours. Cardiac Enzymes: Recent Labs  Lab 05/24/18 0448  CKTOTAL 29*   BNP (last 3 results) No results for input(s): PROBNP in the last 8760 hours. HbA1C: No results for input(s): HGBA1C in the last 72 hours. CBG: Recent Labs  Lab 05/27/18 1109 05/27/18 1617 05/27/18 2100 05/28/18 0744 05/28/18 1119  GLUCAP 221* 156* 219* 166* 259*   Lipid Profile: No results for input(s): CHOL, HDL, LDLCALC, TRIG, CHOLHDL, LDLDIRECT in the last 72 hours. Thyroid Function Tests: No results for input(s): TSH, T4TOTAL, FREET4, T3FREE, THYROIDAB in the last 72 hours. Anemia Panel: No results for input(s): VITAMINB12, FOLATE, FERRITIN, TIBC, IRON, RETICCTPCT in  the last 72 hours. Sepsis Labs: No results for input(s): PROCALCITON, LATICACIDVEN in the last 168 hours.  Recent Results (from the past 240 hour(s))  MRSA PCR Screening     Status: None   Collection Time: 05/25/18  8:53 AM  Result Value Ref Range Status   MRSA by PCR NEGATIVE NEGATIVE Final    Comment:        The GeneXpert MRSA Assay (FDA approved for NASAL specimens only), is one component of a comprehensive MRSA colonization surveillance program. It is not intended to diagnose MRSA infection nor to guide or monitor treatment for MRSA infections. Performed at [facility], [place] [address], [place], [place] [address]          Radiology Studies: No results found.      Scheduled Meds: . apixaban  2.5 mg Oral BID  . atorvastatin  20 mg Oral QHS  . carvedilol  3.125 mg Oral BID WC  .  insulin aspart  0-9 Units Subcutaneous TID WC  . ipratropium  0.5 mg Nebulization Q6H  . levalbuterol  0.63 mg Nebulization Q6H  . methylPREDNISolone (SOLU-MEDROL) injection  40 mg Intravenous BID  . polyethylene glycol  17 g Oral Daily  . senna-docusate  1 tablet Oral BID  . sodium chloride flush  3 mL Intravenous Q12H  . tamsulosin  0.4 mg Oral Daily  . umeclidinium bromide  1 puff Inhalation Daily   Continuous Infusions: . sodium chloride       LOS: 7 days    Time spent: 35 minutes.     Elmarie Shiley, MD [facility] Pager [phone]  If 7PM-7AM, please contact night-coverage [email or web address] Password [facility] 05/28/2018, 2:46 PM

## 2018-05-29 LAB — BASIC METABOLIC PANEL
Anion gap: 18 — ABNORMAL HIGH (ref 5–15)
BUN: 69 mg/dL — AB (ref 8–23)
CO2: 19 mmol/L — ABNORMAL LOW (ref 22–32)
Calcium: 8.6 mg/dL — ABNORMAL LOW (ref 8.9–10.3)
Chloride: 103 mmol/L (ref 98–111)
Creatinine, Ser: 2.34 mg/dL — ABNORMAL HIGH (ref 0.61–1.24)
GFR calc Af Amer: 28 mL/min — ABNORMAL LOW (ref >60)
GFR calc non Af Amer: 24 mL/min — ABNORMAL LOW (ref >60)
Glucose, Bld: 309 mg/dL — ABNORMAL HIGH (ref 70–99)
Potassium: 5 mmol/L (ref 3.5–5.1)
SODIUM: 140 mmol/L (ref 135–145)

## 2018-05-29 LAB — CBC
HCT: 33 % — ABNORMAL LOW (ref 39.0–52.0)
Hemoglobin: 9.9 g/dL — ABNORMAL LOW (ref 13.0–17.0)
MCH: 32.4 pg (ref 26.0–34.0)
MCHC: 30 g/dL (ref 30.0–36.0)
MCV: 107.8 fL — ABNORMAL HIGH (ref 80.0–100.0)
Platelets: 149 10*3/uL — ABNORMAL LOW (ref 150–400)
RBC: 3.06 MIL/uL — ABNORMAL LOW (ref 4.22–5.81)
RDW: 15.9 % — ABNORMAL HIGH (ref 11.5–15.5)
WBC: 12.8 10*3/uL — ABNORMAL HIGH (ref 4.0–10.5)
nRBC: 0 % (ref 0.0–0.2)

## 2018-05-29 LAB — GLUCOSE, CAPILLARY
GLUCOSE-CAPILLARY: 295 mg/dL — AB (ref 70–99)
Glucose-Capillary: 257 mg/dL — ABNORMAL HIGH (ref 70–99)
Glucose-Capillary: 165 mg/dL — ABNORMAL HIGH (ref 70–99)

## 2018-05-29 MED ORDER — PREDNISONE 10 MG PO TABS: 40.0000 mg | ORAL_TABLET | Freq: Every day | ORAL | 0 refills | Status: DC

## 2018-05-29 MED ORDER — INSULIN GLARGINE 100 UNIT/ML ~~LOC~~ SOLN
10.0000 [IU] | Freq: Every day | SUBCUTANEOUS | Status: DC
  Administered 2018-05-29 – 2018-05-30 (×2): 10 [IU] via SUBCUTANEOUS
  Filled 2018-05-29 (×3): qty 0.1

## 2018-05-29 MED ORDER — SENNOSIDES-DOCUSATE SODIUM 8.6-50 MG PO TABS: 1.0000 | ORAL_TABLET | Freq: Two times a day (BID) | ORAL | 0 refills | Status: AC

## 2018-05-29 MED ORDER — CARVEDILOL 3.125 MG PO TABS: 3.1250 mg | ORAL_TABLET | Freq: Two times a day (BID) | ORAL | 0 refills | Status: DC

## 2018-05-29 MED ORDER — IPRATROPIUM BROMIDE 0.02 % IN SOLN
0.5000 mg | Freq: Three times a day (TID) | RESPIRATORY_TRACT | Status: DC
  Administered 2018-05-30 – 2018-06-10 (×34): 0.5 mg via RESPIRATORY_TRACT
  Filled 2018-05-29 (×35): qty 2.5

## 2018-05-29 MED ORDER — HYDRALAZINE HCL 50 MG PO TABS: 50.0000 mg | ORAL_TABLET | Freq: Three times a day (TID) | ORAL | Status: DC

## 2018-05-29 MED ORDER — SIMVASTATIN 40 MG PO TABS: 20.0000 mg | ORAL_TABLET | Freq: Every day | ORAL | 0 refills | Status: DC

## 2018-05-29 MED ORDER — FUROSEMIDE 40 MG PO TABS: 40.0000 mg | ORAL_TABLET | Freq: Every day | ORAL | 0 refills | Status: AC

## 2018-05-29 MED ORDER — AMLODIPINE BESYLATE 5 MG PO TABS
5.0000 mg | ORAL_TABLET | Freq: Every day | ORAL | Status: DC
  Administered 2018-05-29 – 2018-06-10 (×13): 5 mg via ORAL
  Filled 2018-05-29 (×13): qty 1

## 2018-05-29 MED ORDER — HYDRALAZINE HCL 50 MG PO TABS
50.0000 mg | ORAL_TABLET | Freq: Three times a day (TID) | ORAL | Status: DC
  Administered 2018-05-29 – 2018-06-10 (×35): 50 mg via ORAL
  Filled 2018-05-29 (×35): qty 1

## 2018-05-29 MED ORDER — LEVALBUTEROL HCL 1.25 MG/0.5ML IN NEBU
1.2500 mg | INHALATION_SOLUTION | Freq: Four times a day (QID) | RESPIRATORY_TRACT | Status: DC
  Administered 2018-05-29 (×2): 1.25 mg via RESPIRATORY_TRACT
  Filled 2018-05-29 (×2): qty 0.5

## 2018-05-29 MED ORDER — POLYETHYLENE GLYCOL 3350 17 G PO PACK: 17.0000 g | PACK | Freq: Every day | ORAL | 0 refills | Status: AC

## 2018-05-29 MED ORDER — INSULIN GLARGINE 100 UNIT/ML ~~LOC~~ SOLN: 10.0000 [IU] | Freq: Every day | SUBCUTANEOUS | 11 refills | Status: DC

## 2018-05-29 MED ORDER — LEVALBUTEROL HCL 1.25 MG/0.5ML IN NEBU
1.2500 mg | INHALATION_SOLUTION | Freq: Three times a day (TID) | RESPIRATORY_TRACT | Status: DC
  Administered 2018-05-30 – 2018-06-10 (×34): 1.25 mg via RESPIRATORY_TRACT
  Filled 2018-05-29 (×34): qty 0.5

## 2018-05-29 MED ORDER — LEVALBUTEROL HCL 1.25 MG/0.5ML IN NEBU: 1.2500 mg | INHALATION_SOLUTION | Freq: Four times a day (QID) | RESPIRATORY_TRACT | 12 refills | Status: DC

## 2018-05-29 NOTE — Progress Notes (Addendum)
CSW contacted [facility] SNF to determine if [place] social worker has submitted required contract paperwork. Spoke with [employer] who will return call after inquiring with admissions director.   Patient cannot d/c before [place] contract paperwork is submitted to SNF.  Continuing to follow and pending return call from SNF for more information.   Addendum 12/22 8:13AM: Received return call from [facility] staff member, Amy, who reported [facility] contract has not been submitted to facility. Neoma Laming, admissions coordinator, will f/u Monday regarding [facility] contract.   Pricilla Holm, MSW, [place] Social Work [phone]

## 2018-05-29 NOTE — Discharge Summary (Signed)
Physician Discharge Summary  Justin Christensen YIR:[id number] DOB: November 30, 1930 DOA: 05/21/2018  PCP: [name]  Admit date: 05/21/2018 Discharge date: 05/29/2018  Admitted From: OSH Disposition: SNF  Recommendations for Outpatient Follow-up:  1. Follow up with PCP in 1-2 weeks 2. Please obtain BMP/CBC in one week 3. Monitor volume status, follow renal function.     Discharge Condition: Stable.  CODE STATUS:full code.  Diet recommendation: Heart Healthy  Brief/Interim Summary: Brief Narrative: 84 year old with past medical history significant for diabetes, mild thrombocytopenia, COPD, pulmonary hypertension, A. fib, on Eliquis, nephrolithiasis, chronic kidney disease who presents at [facility] with hypoglycemia, thought to be secondary to sulfonylurea use.  Patient received IV fluids, subsequently developed volume overload and received IV Lasix.  Foley was placed to monitor urine output and then patient developed delirium, patient self discontinuing catheter which was traumatic.  Catheter was reinserted and he is been having hematuria.  Had 2 g drop of hemoglobin.  Eliquis has been on hold.  Patient was transferred to [facility] long for urology evaluation.   Assessment & Plan:   Active Problems:   Hypoglycemia   CKD (chronic kidney disease), stage V (HCC)   HTN (hypertension)   AF (paroxysmal atrial fibrillation) (HCC)   COPD (chronic obstructive pulmonary disease) (HCC)   Chronic anemia   Thrombocytopenia (HCC)   BPH (benign prostatic hyperplasia)   Urethral trauma   Pulmonary hypertension, moderate to severe (HCC)   AKI (acute kidney injury) ([name])   Acute blood loss anemia   Acute metabolic encephalopathy   Gross hematuria; Secondary to traumatic Foley removed.  The setting of Eliquis. Urology was consulted, per urology on 1215 continue Foley catheter to drain, once urine is clear gel okay to remove Foley catheter and restart Eliquis. Patient continued to have  hematuria today urine is darker today.  Repeat hemoglobin in the morning. Hb stable.  Hematuria resolved. catheter removed. Resume eliquis. Tolerating eliquis.   Anemia of chronic disease, with acute blood loss, from hematuria. His hemoglobin dropped to 6.6 on 12/15.  He received 2 units of packed red blood cell. Hemoglobin has remained stable around 8 since blood transfusion. Hb stable.   HTN; resume hydralazine.   Mild thrombocytopenia Improving.  Acute on chronic renal disease, stage V -Cr during hospitalization at [facility] prior to transfer to [place] has been around 3-4 Stable.  Resume low dose lasix. stable on low dose lasix.   Mild hyperkalemia, metabolic acidosis. He received a dose of cardiac silhouette, and bicarb Lokelma dose.  Resume lasix.   Paroxysmal A. Fib Not on rate control medication Started on Coreg due to elevated blood pressure. Tolerating eliquis. No further hematuria.   Acute COPD exacerbation  on home oxygen Has persistent bilateral wheezing, will schedule nebulizer every 6 hours. Taper solumedrol, change to prednisone today.  No significant wheezing on lung exam.   Acute metabolic encephalopathy; confuse. Related to delirium, acute illness.  Stable.   Discharge Diagnoses:  Active Problems:   Hypoglycemia   CKD (chronic kidney disease), stage V (HCC)   HTN (hypertension)   AF (paroxysmal atrial fibrillation) (HCC)   COPD (chronic obstructive pulmonary disease) (HCC)   Chronic anemia   Thrombocytopenia (HCC)   BPH (benign prostatic hyperplasia)   Urethral trauma   Pulmonary hypertension, moderate to severe (HCC)   AKI (acute kidney injury) ([name])   Acute blood loss anemia   Acute metabolic encephalopathy    Discharge Instructions  Discharge Instructions    Diet - low sodium  heart healthy   Complete by:  As directed    Increase activity slowly   Complete by:  As directed      Allergies as of 05/29/2018   No  Known Allergies     Medication List    TAKE these medications   acetaminophen 325 MG tablet Commonly known as:  TYLENOL Take 325 mg by mouth every 6 (six) hours as needed (for pain or headaches).   amLODipine 5 MG tablet Commonly known as:  NORVASC Take 1 tablet (5 mg total) by mouth daily.   carvedilol 3.125 MG tablet Commonly known as:  COREG Take 1 tablet (3.125 mg total) by mouth 2 (two) times daily with a meal.   DULoxetine 30 MG capsule Commonly known as:  CYMBALTA Take 30 mg by mouth daily.   ELIQUIS 2.5 MG Tabs tablet Generic drug:  apixaban Take 2.5 mg by mouth 2 (two) times daily.   feeding supplement (GLUCERNA SHAKE) Liqd Take 237 mLs by mouth 3 (three) times daily between meals.   furosemide 40 MG tablet Commonly known as:  LASIX Take 1 tablet (40 mg total) by mouth daily. What changed:    when to take this  Another medication with the same name was removed. Continue taking this medication, and follow the directions you see here.   hydrALAZINE 25 MG tablet Commonly known as:  APRESOLINE Take 2 tablets (50 mg total) by mouth 3 (three) times daily. What changed:  Another medication with the same name was removed. Continue taking this medication, and follow the directions you see here.   HYDROcodone-acetaminophen 5-325 MG tablet Commonly known as:  NORCO/VICODIN Take 1 tablet by mouth every 8 (eight) hours as needed (for pain).   insulin aspart 100 UNIT/ML injection Commonly known as:  novoLOG CBG < 70: implement hypoglycemia protocol CBG 70 - 120: 0 units CBG 121 - 150: 2 units CBG 151 - 200: 3 units CBG 201 - 250: 5 units CBG 251 - 300: 8 units CBG 301 - 350: 11 units CBG 351 - 400: 15 units CBG > 400: call MD   insulin glargine 100 UNIT/ML injection Commonly known as:  LANTUS Inject 0.1 mLs (10 Units total) into the skin daily.   levalbuterol 1.25 MG/0.5ML nebulizer solution Commonly known as:  XOPENEX Take 1.25 mg by nebulization every 6  (six) hours.   ONE-A-DAY MENS 50+ ADVANTAGE Tabs Take 1 tablet by mouth daily.   polyethylene glycol packet Commonly known as:  MIRALAX / GLYCOLAX Take 17 g by mouth daily.   predniSONE 10 MG tablet Commonly known as:  DELTASONE Take 4 tablets (40 mg total) by mouth daily for 4 days.   PROVENTIL HFA 108 (90 Base) MCG/ACT inhaler Generic drug:  albuterol Inhale 1-2 puffs into the lungs every 6 (six) hours as needed for wheezing or shortness of breath. What changed:  Another medication with the same name was removed. Continue taking this medication, and follow the directions you see here.   senna-docusate 8.6-50 MG tablet Commonly known as:  Senokot-S Take 1 tablet by mouth 2 (two) times daily.   simethicone 80 MG chewable tablet Commonly known as:  MYLICON Chew 80 mg by mouth every 6 (six) hours as needed for flatulence.   simvastatin 40 MG tablet Commonly known as:  ZOCOR Take 0.5 tablets (20 mg total) by mouth at bedtime. What changed:  how much to take   SPIRIVA RESPIMAT 1.25 MCG/ACT Aers Generic drug:  Tiotropium Bromide Monohydrate Inhale 1 puff into  the lungs daily.   terazosin 5 MG capsule Commonly known as:  HYTRIN Take 5 mg by mouth at bedtime.       No Known Allergies  Consultations:  Urology    Procedures/Studies: Dg Chest 2 View  Result Date: 05/22/2018 CLINICAL DATA:  Acute kidney injury. EXAM: CHEST - 2 VIEW COMPARISON:  Chest radiograph May 17, 2018 FINDINGS: Cardiac silhouette is upper limits of normal in size. Calcified aortic arch. Small RIGHT pleural effusion. Bronchitic changes/interstitial changes. No pneumothorax. Soft tissue planes and included osseous structures are non suspicious. IMPRESSION: Small RIGHT pleural effusion. Similar bronchitic changes/chronic interstitial lung disease. Aortic Atherosclerosis (ICD10-I70.0). Electronically Signed   By: Elon Alas M.D.   On: 05/22/2018 00:33      Subjective: Breathing better,  confuse.   Discharge Exam: Vitals:   05/29/18 0716 05/29/18 0807  BP: (!) 185/104   Pulse: 89   Resp:    Temp:    SpO2:  (!) 85%   Vitals:   05/29/18 0132 05/29/18 0500 05/29/18 0716 05/29/18 0807  BP:  (!) 196/95 (!) 185/104   Pulse:  65 89   Resp:  18    Temp:  98 F (36.7 C)    TempSrc:  Oral    SpO2: 97% 98%  (!) 85%  Weight:      Height:        General: Pt is alert, awake, not in acute distress Cardiovascular: RRR, S1/S2 +, no rubs, no gallops Respiratory: sporadic wheezing.  Abdominal: Soft, NT, ND, bowel sounds + Extremities: no edema, no cyanosis    The results of significant diagnostics from this hospitalization (including imaging, microbiology, ancillary and laboratory) are listed below for reference.     Microbiology: Recent Results (from the past 240 hour(s))  MRSA PCR Screening     Status: None   Collection Time: 05/25/18  8:53 AM  Result Value Ref Range Status   MRSA by PCR NEGATIVE NEGATIVE Final    Comment:        The GeneXpert MRSA Assay (FDA approved for NASAL specimens only), is one component of a comprehensive MRSA colonization surveillance program. It is not intended to diagnose MRSA infection nor to guide or monitor treatment for MRSA infections. Performed at [facility], [place] [address], [place], [place] [address]      Labs: BNP (last 3 results) Recent Labs    05/21/18 2251  BNP 366.4*   Basic Metabolic Panel: Recent Labs  Lab 05/25/18 0538 05/26/18 0445 05/27/18 0409 05/28/18 0405 05/29/18 0519  NA 145 145 145 143 140  K 4.6 5.1 5.5* 5.3* 5.0  CL 113* 112* 109 109 103  CO2 24 24 27 26  PENDING  GLUCOSE 217* 249* 158* 140* 309*  BUN 88* 76* 62* 57* 69*  CREATININE 3.26* 2.66* 2.35* 2.12* 2.34*  CALCIUM 8.6* 8.7* 9.1 9.0 8.6*   Liver Function Tests: Recent Labs  Lab 05/23/18 0529  AST 19  ALT 25  ALKPHOS 76  BILITOT 0.7  PROT 5.5*  ALBUMIN 3.1*   No results for input(s): LIPASE, AMYLASE  in the last 168 hours. Recent Labs  Lab 05/23/18 0529  AMMONIA 35   CBC: Recent Labs  Lab 05/25/18 0538 05/26/18 0445 05/27/18 0409 05/28/18 0405 05/29/18 0519  WBC 7.1 6.9 7.0 7.0 12.8*  NEUTROABS  --  5.5  --   --   --   HGB 8.8* 9.0* 9.6* 9.4* 9.9*  HCT 29.0* 30.5* 33.2* 32.3* 33.0*  MCV 104.7*  107.4* 110.3* 108.4* 107.8*  PLT 146* 139* 143* 140* 149*   Cardiac Enzymes: Recent Labs  Lab 05/24/18 0448  CKTOTAL 29*   BNP: Invalid input(s): POCBNP CBG: Recent Labs  Lab 05/28/18 0744 05/28/18 1119 05/28/18 1617 05/28/18 2049 05/29/18 0752  GLUCAP 166* 259* 347* 203* 295*   D-Dimer No results for input(s): DDIMER in the last 72 hours. Hgb A1c No results for input(s): HGBA1C in the last 72 hours. Lipid Profile No results for input(s): CHOL, HDL, LDLCALC, TRIG, CHOLHDL, LDLDIRECT in the last 72 hours. Thyroid function studies No results for input(s): TSH, T4TOTAL, T3FREE, THYROIDAB in the last 72 hours.  Invalid input(s): FREET3 Anemia work up No results for input(s): VITAMINB12, FOLATE, FERRITIN, TIBC, IRON, RETICCTPCT in the last 72 hours. Urinalysis    Component Value Date/Time   COLORURINE RED (A) 05/21/2018 2222   APPEARANCEUR TURBID (A) 05/21/2018 2222   LABSPEC  05/21/2018 2222    TEST NOT REPORTED DUE TO COLOR INTERFERENCE OF URINE PIGMENT   PHURINE  05/21/2018 2222    TEST NOT REPORTED DUE TO COLOR INTERFERENCE OF URINE PIGMENT   GLUCOSEU (A) 05/21/2018 2222    TEST NOT REPORTED DUE TO COLOR INTERFERENCE OF URINE PIGMENT   HGBUR (A) 05/21/2018 2222    TEST NOT REPORTED DUE TO COLOR INTERFERENCE OF URINE PIGMENT   BILIRUBINUR (A) 05/21/2018 2222    TEST NOT REPORTED DUE TO COLOR INTERFERENCE OF URINE PIGMENT   KETONESUR (A) 05/21/2018 2222    TEST NOT REPORTED DUE TO COLOR INTERFERENCE OF URINE PIGMENT   PROTEINUR (A) 05/21/2018 2222    TEST NOT REPORTED DUE TO COLOR INTERFERENCE OF URINE PIGMENT   NITRITE (A) 05/21/2018 2222    TEST NOT  REPORTED DUE TO COLOR INTERFERENCE OF URINE PIGMENT   LEUKOCYTESUR (A) 05/21/2018 2222    TEST NOT REPORTED DUE TO COLOR INTERFERENCE OF URINE PIGMENT   Sepsis Labs Invalid input(s): PROCALCITONIN,  WBC,  LACTICIDVEN Microbiology Recent Results (from the past 240 hour(s))  MRSA PCR Screening     Status: None   Collection Time: 05/25/18  8:53 AM  Result Value Ref Range Status   MRSA by PCR NEGATIVE NEGATIVE Final    Comment:        The GeneXpert MRSA Assay (FDA approved for NASAL specimens only), is one component of a comprehensive MRSA colonization surveillance program. It is not intended to diagnose MRSA infection nor to guide or monitor treatment for MRSA infections. Performed at [facility], [place] [address] [place], [place] [address]      Time coordinating discharge: 35 minutes.   SIGNED:   Elmarie Shiley, MD  [facility] 05/29/2018, 9:43 AM Pager   If 7PM-7AM, please contact night-coverage [email or web address] Password [email or web address]

## 2018-05-30 LAB — GLUCOSE, CAPILLARY
GLUCOSE-CAPILLARY: 173 mg/dL — AB (ref 70–99)
Glucose-Capillary: 158 mg/dL — ABNORMAL HIGH (ref 70–99)
Glucose-Capillary: 152 mg/dL — ABNORMAL HIGH (ref 70–99)
Glucose-Capillary: 134 mg/dL — ABNORMAL HIGH (ref 70–99)
Glucose-Capillary: 166 mg/dL — ABNORMAL HIGH (ref 70–99)

## 2018-05-30 LAB — BASIC METABOLIC PANEL
Anion gap: 10 (ref 5–15)
BUN: 70 mg/dL — ABNORMAL HIGH (ref 8–23)
CO2: 29 mmol/L (ref 22–32)
Calcium: 8.8 mg/dL — ABNORMAL LOW (ref 8.9–10.3)
Chloride: 102 mmol/L (ref 98–111)
Creatinine, Ser: 2.26 mg/dL — ABNORMAL HIGH (ref 0.61–1.24)
GFR calc Af Amer: 30 mL/min — ABNORMAL LOW (ref >60)
GFR calc non Af Amer: 25 mL/min — ABNORMAL LOW (ref >60)
Glucose, Bld: 159 mg/dL — ABNORMAL HIGH (ref 70–99)
Potassium: 4.6 mmol/L (ref 3.5–5.1)
Sodium: 141 mmol/L (ref 135–145)

## 2018-05-30 MED ORDER — METHYLPREDNISOLONE SODIUM SUCC 40 MG IJ SOLR
40.0000 mg | Freq: Two times a day (BID) | INTRAMUSCULAR | Status: DC
  Administered 2018-05-30 – 2018-05-31 (×3): 40 mg via INTRAVENOUS
  Filled 2018-05-30 (×3): qty 1

## 2018-05-30 NOTE — Progress Notes (Signed)
PROGRESS NOTE    Justin Christensen  AYT:[id number] DOB: 09-Jul-1930 DOA: 05/21/2018 [facility]: [name]  Brief Narrative: 84 year old with past medical history significant for diabetes, mild thrombocytopenia, COPD, pulmonary hypertension, A. fib, on Eliquis, nephrolithiasis, chronic kidney disease who [facility] with hypoglycemia, thought to be secondary to sulfonylurea use.  Patient received IV fluids, subsequently developed volume overload and received IV Lasix.  Foley was placed to monitor urine output and then patient developed delirium, patient self discontinuing catheter which was traumatic.  Catheter was reinserted and he is been having hematuria.  Had 2 g drop of hemoglobin.  Eliquis has been on hold.  Patient was transferred to [facility] long for urology evaluation.   Assessment & Plan:   Active Problems:   Hypoglycemia   CKD (chronic kidney disease), stage V (HCC)   HTN (hypertension)   AF (paroxysmal atrial fibrillation) (HCC)   COPD (chronic obstructive pulmonary disease) (HCC)   Chronic anemia   Thrombocytopenia (HCC)   BPH (benign prostatic hyperplasia)   Urethral trauma   Pulmonary hypertension, moderate to severe (HCC)   AKI (acute kidney injury) ([place])   Acute blood loss anemia   Acute metabolic encephalopathy   Gross hematuria; Secondary to traumatic Foley removed.  The setting of Eliquis. Urology was consulted, per urology on 1215 continue Foley catheter to drain, once urine is clear gel okay to remove Foley catheter and restart Eliquis. Patient continued to have hematuria today urine is darker today.  Repeat hemoglobin in the morning. Hb stable.  Hematuria resolved. catheter removed. Resume eliquis.  Tolerating eliquis.   Anemia of chronic disease, with acute blood loss, from hematuria. His hemoglobin dropped to 6.6 on 12/15.  He received 2 units of packed red blood cell. Hemoglobin has remained stable around 8 since blood transfusion. Hb stable.    HTN; resume hydralazine.   Mild thrombocytopenia Improving.  Acute on chronic renal disease, stage V -Cr during hospitalization at [facility] prior to transfer to [place] has been around 3-4 Stable.  Resume low dose lasix.  Repeat Bmet.   Mild hyperkalemia, metabolic acidosis. He received a dose of cardiac silhouette, and bicarb Lokelma dose.  Resume lasix.   Paroxysmal A. Fib Not on rate control medication Started on Coreg due to elevated blood pressure. Continue to hold Eliquis due to hematuria.  Acute COPD exacerbation  on home oxygen Has persistent bilateral wheezing, will schedule nebulizer every 6 hours. Continue with  solumedrol.   RN Pressure Injury Documentation:    Malnutrition Type:      Malnutrition Characteristics:      Nutrition Interventions:     Estimated body mass index is 27.44 kg/m as calculated from the following:   Height as of this encounter: 5\' 5"  (1.651 m).   Weight as of this encounter: 74.8 kg.   DVT prophylaxis: SCDs Code Status: (Full code Family Communication: Care discussed with patient Disposition Plan: Will likely need SNF  Consultants:   Urology  Procedures:  None Antimicrobials: None  Subjective: Confuse, denies dyspnea.   Objective: Vitals:   05/29/18 2011 05/30/18 0433 05/30/18 0816 05/30/18 0819  BP:  (!) 158/93    Pulse:  92    Resp:  18    Temp:  97.9 F (36.6 C)    TempSrc:  Oral    SpO2: 96% 90% 90% 90%  Weight:      Height:        Intake/Output Summary (Last 24 hours) at 05/30/2018 1429 Last data  filed at 05/30/2018 1242 Gross per 24 hour  Intake 1300 ml  Output 2901 ml  Net -1601 ml   Filed Weights   05/21/18 1906 05/25/18 0559  Weight: 77.9 kg 74.8 kg    Examination:  General exam: NAD Respiratory system: Bilateral wheezing.  Cardiovascular system: S 1, S 2 RRR Gastrointestinal system: BS present, soft, nt Central nervous system: Confuse.   Extremities; no  edema   Data Reviewed: I have personally reviewed following labs and imaging studies  CBC: Recent Labs  Lab 05/25/18 0538 05/26/18 0445 05/27/18 0409 05/28/18 0405 05/29/18 0519  WBC 7.1 6.9 7.0 7.0 12.8*  NEUTROABS  --  5.5  --   --   --   HGB 8.8* 9.0* 9.6* 9.4* 9.9*  HCT 29.0* 30.5* 33.2* 32.3* 33.0*  MCV 104.7* 107.4* 110.3* 108.4* 107.8*  PLT 146* 139* 143* 140* 607*   Basic Metabolic Panel: Recent Labs  Lab 05/25/18 0538 05/26/18 0445 05/27/18 0409 05/28/18 0405 05/29/18 0519  NA 145 145 145 143 140  K 4.6 5.1 5.5* 5.3* 5.0  CL 113* 112* 109 109 103  CO2 24 24 27 26  19*  GLUCOSE 217* 249* 158* 140* 309*  BUN 88* 76* 62* 57* 69*  CREATININE 3.26* 2.66* 2.35* 2.12* 2.34*  CALCIUM 8.6* 8.7* 9.1 9.0 8.6*   GFR: Estimated Creatinine Clearance: 21.8 mL/min (A) (by C-G formula based on SCr of 2.34 mg/dL (H)). Liver Function Tests: No results for input(s): AST, ALT, ALKPHOS, BILITOT, PROT, ALBUMIN in the last 168 hours. No results for input(s): LIPASE, AMYLASE in the last 168 hours. No results for input(s): AMMONIA in the last 168 hours. Coagulation Profile: No results for input(s): INR, PROTIME in the last 168 hours. Cardiac Enzymes: Recent Labs  Lab 05/24/18 0448  CKTOTAL 29*   BNP (last 3 results) No results for input(s): PROBNP in the last 8760 hours. HbA1C: No results for input(s): HGBA1C in the last 72 hours. CBG: Recent Labs  Lab 05/29/18 1159 05/29/18 1714 05/29/18 1949 05/30/18 0726 05/30/18 1152  GLUCAP 257* 165* 173* 158* 152*   Lipid Profile: No results for input(s): CHOL, HDL, LDLCALC, TRIG, CHOLHDL, LDLDIRECT in the last 72 hours. Thyroid Function Tests: No results for input(s): TSH, T4TOTAL, FREET4, T3FREE, THYROIDAB in the last 72 hours. Anemia Panel: No results for input(s): VITAMINB12, FOLATE, FERRITIN, TIBC, IRON, RETICCTPCT in the last 72 hours. Sepsis Labs: No results for input(s): PROCALCITON, LATICACIDVEN in the last 168  hours.  Recent Results (from the past 240 hour(s))  MRSA PCR Screening     Status: None   Collection Time: 05/25/18  8:53 AM  Result Value Ref Range Status   MRSA by PCR NEGATIVE NEGATIVE Final    Comment:        The GeneXpert MRSA Assay (FDA approved for NASAL specimens only), is one component of a comprehensive MRSA colonization surveillance program. It is not intended to diagnose MRSA infection nor to guide or monitor treatment for MRSA infections. Performed at [facility], [place] [address] [place], [place] [address]          Radiology Studies: No results found.      Scheduled Meds: . amLODipine  5 mg Oral Daily  . apixaban  2.5 mg Oral BID  . atorvastatin  20 mg Oral QHS  . carvedilol  3.125 mg Oral BID WC  . furosemide  40 mg Oral Daily  . hydrALAZINE  50 mg Oral Q8H  . insulin aspart  0-9  Units Subcutaneous TID WC  . insulin glargine  10 Units Subcutaneous Daily  . ipratropium  0.5 mg Nebulization TID  . levalbuterol  1.25 mg Nebulization TID  . methylPREDNISolone (SOLU-MEDROL) injection  40 mg Intravenous Q12H  . polyethylene glycol  17 g Oral Daily  . senna-docusate  1 tablet Oral BID  . sodium chloride flush  3 mL Intravenous Q12H  . tamsulosin  0.4 mg Oral Daily  . umeclidinium bromide  1 puff Inhalation Daily   Continuous Infusions: . sodium chloride       LOS: 9 days    Time spent: 35 minutes.     Elmarie Shiley, MD [facility] Pager [phone]  If 7PM-7AM, please contact night-coverage [email or web address] Password [facility] 05/30/2018, 2:29 PM

## 2018-05-31 LAB — GLUCOSE, CAPILLARY
Glucose-Capillary: 398 mg/dL — ABNORMAL HIGH (ref 70–99)
Glucose-Capillary: 388 mg/dL — ABNORMAL HIGH (ref 70–99)
Glucose-Capillary: 197 mg/dL — ABNORMAL HIGH (ref 70–99)
Glucose-Capillary: 182 mg/dL — ABNORMAL HIGH (ref 70–99)

## 2018-05-31 MED ORDER — METHYLPREDNISOLONE SODIUM SUCC 40 MG IJ SOLR
20.0000 mg | Freq: Two times a day (BID) | INTRAMUSCULAR | Status: DC
  Administered 2018-05-31 – 2018-06-02 (×5): 20 mg via INTRAVENOUS
  Filled 2018-05-31 (×5): qty 1

## 2018-05-31 MED ORDER — INSULIN GLARGINE 100 UNIT/ML ~~LOC~~ SOLN
20.0000 [IU] | Freq: Every day | SUBCUTANEOUS | Status: DC
  Administered 2018-06-01: 20 [IU] via SUBCUTANEOUS
  Filled 2018-05-31: qty 0.2

## 2018-05-31 MED ORDER — INSULIN GLARGINE 100 UNIT/ML ~~LOC~~ SOLN
5.0000 [IU] | Freq: Once | SUBCUTANEOUS | Status: AC
  Administered 2018-05-31: 5 [IU] via SUBCUTANEOUS
  Filled 2018-05-31: qty 0.05

## 2018-05-31 MED ORDER — INSULIN GLARGINE 100 UNIT/ML ~~LOC~~ SOLN
15.0000 [IU] | Freq: Every day | SUBCUTANEOUS | Status: DC
  Administered 2018-05-31: 15 [IU] via SUBCUTANEOUS
  Filled 2018-05-31 (×2): qty 0.15

## 2018-05-31 NOTE — Care Management Important Message (Signed)
Important Message  Patient Details  Name: KEEVON HENNEY MRN: [id number] Date of Birth: 01-06-1931   Medicare Important Message Given:  Yes    Kerin Salen 05/31/2018, 10:50 AMImportant Message  Patient Details  Name: TAARIQ LEITZ MRN: [id number] Date of Birth: [birth year]   Medicare Important Message Given:  Yes    Kerin Salen 05/31/2018, 10:50 AM

## 2018-05-31 NOTE — Progress Notes (Signed)
CSW received a call from [place].. [facility] will submit patient authorization information this am SNF- [facility] in [place]. CSW updated the patient and his daughter [name].  CSW will continue to assist with discharge planning.   Kathrin Greathouse, Marlinda Mike, MSW Clinical Social Worker  [phone] 05/31/2018  10:36 AM

## 2018-05-31 NOTE — Progress Notes (Signed)
Physical Therapy Treatment Patient Details Name: Justin Christensen MRN: [id number] DOB: 04-06-1931 Today's Date: 05/31/2018    History of Present Illness 84 y.o. male admitted with hypoglycemia, confusion, and hematuria. PMH significant for DM, mild thrombocytopenia, HTN, COPD, pulmonary HTN, atrial fibrillation on eliquis, nephrolithiasis, CKD and anemia.    PT Comments    Assisted pt from bed to recliner with RW. Mod assist for supine to sit, min assist for stand pivot transfer. Pt oriented to self only.  Follow Up Recommendations  SNF     Equipment Recommendations  Rolling walker with 5" wheels    Recommendations for Other Services       Precautions / Restrictions Precautions Precautions: Fall Restrictions Weight Bearing Restrictions: No    Mobility  Bed Mobility Overal bed mobility: Needs Assistance Bed Mobility: Supine to Sit     Supine to sit: Mod assist     General bed mobility comments: Increased time. Cues for safety, technique. Utilized bedpad to aid with scooting.   Transfers Overall transfer level: Needs assistance Equipment used: Rolling walker (2 wheeled) Transfers: Sit to/from [facility] Sit to Stand: Min assist Stand pivot transfers: Min assist       General transfer comment: Increased time. Assist to rise, stabilize, control descent. VCs safety, technique, hand placement. Stand pivot, bed to recliner, with RW.   Ambulation/Gait Ambulation/Gait assistance: Min assist Gait Distance (Feet): 3 Feet Assistive device: Rolling walker (2 wheeled) Gait Pattern/deviations: Step-to pattern;Trunk flexed Gait velocity: decr   General Gait Details: Pt took a few steps in room with a RW from bed to recliner   Stairs             Wheelchair Mobility    Modified Rankin (Stroke Patients Only)       Balance Overall balance assessment: Needs assistance         Standing balance support: Bilateral upper extremity  supported Standing balance-Leahy Scale: Poor                              Cognition Arousal/Alertness: Awake/alert Behavior During Therapy: WFL for tasks assessed/performed Overall Cognitive Status: Impaired/Different from baseline Area of Impairment: Orientation;Attention;Memory                 Orientation Level: Disoriented to;Place;Time;Situation   Memory: Decreased short-term memory;Decreased recall of precautions                Exercises      General Comments        Pertinent Vitals/Pain Pain Assessment: No/denies pain    Home Living                      Prior Function            PT Goals (current goals can now be found in the care plan section) Acute Rehab PT Goals Patient Stated Goal: "I need to get out of here and get my truck from [place]".  PT Goal Formulation: Patient unable to participate in goal setting Time For Goal Achievement: 06/08/18 Potential to Achieve Goals: Fair Progress towards PT goals: Progressing toward goals    Frequency    Min 2X/week      PT Plan Current plan remains appropriate    Co-evaluation              AM-PAC PT "6 Clicks" Mobility   Outcome Measure  Help needed turning from your back to  your side while in a flat bed without using bedrails?: A Little Help needed moving from lying on your back to sitting on the side of a flat bed without using bedrails?: A Little Help needed moving to and from a bed to a chair (including a wheelchair)?: A Little Help needed standing up from a chair using your arms (e.g., wheelchair or bedside chair)?: A Lot Help needed to walk in hospital room?: A Lot Help needed climbing 3-5 steps with a railing? : Total 6 Click Score: 14    End of Session Equipment Utilized During Treatment: Gait belt;Oxygen Activity Tolerance: Patient limited by fatigue Patient left: in chair;with call bell/phone within reach;with chair alarm set Nurse Communication:  Mobility status PT Visit Diagnosis: Muscle weakness (generalized) (M62.81);Other abnormalities of gait and mobility (R26.89);Difficulty in walking, not elsewhere classified (R26.2)     Time: 4098-1191 PT Time Calculation (min) (ACUTE ONLY): 17 min  Charges:  $Therapeutic Activity: 8-22 mins                     Blondell Reveal Kistler PT 05/31/2018  Acute Rehabilitation Services Pager [phone] Office [phone]

## 2018-05-31 NOTE — Progress Notes (Signed)
PROGRESS NOTE    Justin Christensen  HYW:[id number] DOB: June 22, 1930 DOA: 05/21/2018 PCP: [name]  Brief Narrative: 84 year old with past medical history significant for diabetes, mild thrombocytopenia, COPD, pulmonary hypertension, A. fib, on Eliquis, nephrolithiasis, chronic kidney disease who presents at [facility] with hypoglycemia, thought to be secondary to sulfonylurea use.  Patient received IV fluids, subsequently developed volume overload and received IV Lasix.  Foley was placed to monitor urine output and then patient developed delirium, patient self discontinuing catheter which was traumatic.  Catheter was reinserted and he is been having hematuria.  Had 2 g drop of hemoglobin.  Eliquis has been on hold.  Patient was transferred to [facility] for urology evaluation.  Patient hematuria has resolved.  He has been tolerating Eliquis.  His chronic renal failure has been stable.  For COPD exacerbation he was a started on IV Solu-Medrol.  I suspect that he might have some baseline wheezing.  Assessment & Plan:   Active Problems:   Hypoglycemia   CKD (chronic kidney disease), stage V (HCC)   HTN (hypertension)   AF (paroxysmal atrial fibrillation) (HCC)   COPD (chronic obstructive pulmonary disease) (HCC)   Chronic anemia   Thrombocytopenia (HCC)   BPH (benign prostatic hyperplasia)   Urethral trauma   Pulmonary hypertension, moderate to severe (HCC)   AKI (acute kidney injury) ([place])   Acute blood loss anemia   Acute metabolic encephalopathy   Gross hematuria; Secondary to traumatic Foley removed.  The setting of Eliquis. Urology was consulted, per urology on 1215 continue Foley catheter to drain, once urine is clear gel okay to remove Foley catheter and restart Eliquis. Patient continued to have hematuria today urine is darker today.  Repeat hemoglobin in the morning. Hb stable.  Hematuria resolved. catheter removed. Resume eliquis.  Tolerating eliquis.   Anemia of  chronic disease, with acute blood loss, from hematuria. His hemoglobin dropped to 6.6 on 12/15.  He received 2 units of packed red blood cell. Hemoglobin has remained stable around 8 since blood transfusion. Hb stable.   HTN; continue with hydralazine.   Diabetes type 2; uncontrolled; hyperglycemia Increase Lantus to 20 units,. Hyperglycemia likely in the setting of Solu-Medrol. Continue with a sliding scale.  Mild thrombocytopenia Improving.  Acute on chronic renal disease, stage V -Cr during hospitalization at [facility] prior to transfer to [place] has been around 3-4 Stable.  Resume low dose lasix.  Renal function stable  Mild hyperkalemia, metabolic acidosis. He received a dose of cardiac silhouette, and bicarb Lokelma dose.  Resume lasix.   Paroxysmal A. Fib Not on rate control medication Started on Coreg due to elevated blood pressure. Continue to hold Eliquis due to hematuria.  Acute COPD exacerbation  on home oxygen Continue with schedule nebulizer every 6 hours. Continue with  solumedrol.  When bed available can transition to oral prednisone.      Estimated body mass index is 27.44 kg/m as calculated from the following:   Height as of this encounter: 5\' 5"  (1.651 m).   Weight as of this encounter: 74.8 kg.   DVT prophylaxis: SCDs Code Status: (Full code Family Communication: Care discussed with patient Disposition Plan: Will likely need SNF  Consultants:   Urology  Procedures:  None Antimicrobials: None  Subjective: Confused, denies shortness of breath.  Objective: Vitals:   05/31/18 0451 05/31/18 0837 05/31/18 1343 05/31/18 1400  BP: (!) 146/58   (!) 143/81  Pulse: 78   92  Resp: 18  20  Temp: 98 F (36.7 C)   98.2 F (36.8 C)  TempSrc: Oral   Oral  SpO2: 93% 93% 93% 93%  Weight:      Height:        Intake/Output Summary (Last 24 hours) at 05/31/2018 1439 Last data filed at 05/31/2018 1300 Gross per 24 hour  Intake  483 ml  Output 775 ml  Net -292 ml   Filed Weights   05/21/18 1906 05/25/18 0559  Weight: 77.9 kg 74.8 kg    Examination:  General exam: NAD Respiratory system: Dry wheezing Cardiovascular system: S1, S2 regular rhythm and rate Gastrointestinal system: Bowel sounds present, soft nontender nondistended Central nervous system: Pleasantly confused Extremities; edema   Data Reviewed: I have personally reviewed following labs and imaging studies  CBC: Recent Labs  Lab 05/25/18 0538 05/26/18 0445 05/27/18 0409 05/28/18 0405 05/29/18 0519  WBC 7.1 6.9 7.0 7.0 12.8*  NEUTROABS  --  5.5  --   --   --   HGB 8.8* 9.0* 9.6* 9.4* 9.9*  HCT 29.0* 30.5* 33.2* 32.3* 33.0*  MCV 104.7* 107.4* 110.3* 108.4* 107.8*  PLT 146* 139* 143* 140* 242*   Basic Metabolic Panel: Recent Labs  Lab 05/26/18 0445 05/27/18 0409 05/28/18 0405 05/29/18 0519 05/30/18 1504  NA 145 145 143 140 141  K 5.1 5.5* 5.3* 5.0 4.6  CL 112* 109 109 103 102  CO2 24 27 26  19* 29  GLUCOSE 249* 158* 140* 309* 159*  BUN 76* 62* 57* 69* 70*  CREATININE 2.66* 2.35* 2.12* 2.34* 2.26*  CALCIUM 8.7* 9.1 9.0 8.6* 8.8*   GFR: Estimated Creatinine Clearance: 22.6 mL/min (A) (by C-G formula based on SCr of 2.26 mg/dL (H)). Liver Function Tests: No results for input(s): AST, ALT, ALKPHOS, BILITOT, PROT, ALBUMIN in the last 168 hours. No results for input(s): LIPASE, AMYLASE in the last 168 hours. No results for input(s): AMMONIA in the last 168 hours. Coagulation Profile: No results for input(s): INR, PROTIME in the last 168 hours. Cardiac Enzymes: No results for input(s): CKTOTAL, CKMB, CKMBINDEX, TROPONINI in the last 168 hours. BNP (last 3 results) No results for input(s): PROBNP in the last 8760 hours. HbA1C: No results for input(s): HGBA1C in the last 72 hours. CBG: Recent Labs  Lab 05/30/18 1152 05/30/18 1630 05/30/18 2033 05/31/18 0742 05/31/18 1134  GLUCAP 152* 134* 166* 398* 388*   Lipid  Profile: No results for input(s): CHOL, HDL, LDLCALC, TRIG, CHOLHDL, LDLDIRECT in the last 72 hours. Thyroid Function Tests: No results for input(s): TSH, T4TOTAL, FREET4, T3FREE, THYROIDAB in the last 72 hours. Anemia Panel: No results for input(s): VITAMINB12, FOLATE, FERRITIN, TIBC, IRON, RETICCTPCT in the last 72 hours. Sepsis Labs: No results for input(s): PROCALCITON, LATICACIDVEN in the last 168 hours.  Recent Results (from the past 240 hour(s))  MRSA PCR Screening     Status: None   Collection Time: 05/25/18  8:53 AM  Result Value Ref Range Status   MRSA by PCR NEGATIVE NEGATIVE Final    Comment:        The GeneXpert MRSA Assay (FDA approved for NASAL specimens only), is one component of a comprehensive MRSA colonization surveillance program. It is not intended to diagnose MRSA infection nor to guide or monitor treatment for MRSA infections. Performed at [facility], [place] [address] [place], [place] [address]          Radiology Studies: No results found.      Scheduled Meds: . amLODipine  5 mg Oral Daily  . apixaban  2.5 mg Oral BID  . atorvastatin  20 mg Oral QHS  . carvedilol  3.125 mg Oral BID WC  . furosemide  40 mg Oral Daily  . hydrALAZINE  50 mg Oral Q8H  . insulin aspart  0-9 Units Subcutaneous TID WC  . [START ON 06/01/2018] insulin glargine  20 Units Subcutaneous Daily  . insulin glargine  5 Units Subcutaneous Once  . ipratropium  0.5 mg Nebulization TID  . levalbuterol  1.25 mg Nebulization TID  . methylPREDNISolone (SOLU-MEDROL) injection  40 mg Intravenous Q12H  . polyethylene glycol  17 g Oral Daily  . senna-docusate  1 tablet Oral BID  . sodium chloride flush  3 mL Intravenous Q12H  . tamsulosin  0.4 mg Oral Daily  . umeclidinium bromide  1 puff Inhalation Daily   Continuous Infusions: . sodium chloride       LOS: 10 days    Time spent: 35 minutes.     Elmarie Shiley, MD [facility] Pager  [phone]  If 7PM-7AM, please contact night-coverage [email or web address] Password [facility] 05/31/2018, 2:39 PM

## 2018-06-01 LAB — BASIC METABOLIC PANEL
ANION GAP: 9 (ref 5–15)
BUN: 89 mg/dL — ABNORMAL HIGH (ref 8–23)
CALCIUM: 8.3 mg/dL — AB (ref 8.9–10.3)
CO2: 31 mmol/L (ref 22–32)
Chloride: 98 mmol/L (ref 98–111)
Creatinine, Ser: 2.51 mg/dL — ABNORMAL HIGH (ref 0.61–1.24)
GFR calc Af Amer: 26 mL/min — ABNORMAL LOW (ref >60)
GFR calc non Af Amer: 22 mL/min — ABNORMAL LOW (ref >60)
Glucose, Bld: 225 mg/dL — ABNORMAL HIGH (ref 70–99)
Potassium: 4.8 mmol/L (ref 3.5–5.1)
Sodium: 138 mmol/L (ref 135–145)

## 2018-06-01 LAB — GLUCOSE, CAPILLARY
GLUCOSE-CAPILLARY: 364 mg/dL — AB (ref 70–99)
Glucose-Capillary: 251 mg/dL — ABNORMAL HIGH (ref 70–99)
Glucose-Capillary: 253 mg/dL — ABNORMAL HIGH (ref 70–99)
Glucose-Capillary: 260 mg/dL — ABNORMAL HIGH (ref 70–99)

## 2018-06-01 MED ORDER — INSULIN GLARGINE 100 UNIT/ML ~~LOC~~ SOLN
25.0000 [IU] | Freq: Every day | SUBCUTANEOUS | Status: DC
  Administered 2018-06-02: 25 [IU] via SUBCUTANEOUS
  Filled 2018-06-01 (×2): qty 0.25

## 2018-06-01 MED ORDER — INSULIN ASPART 100 UNIT/ML ~~LOC~~ SOLN
6.0000 [IU] | Freq: Once | SUBCUTANEOUS | Status: AC
  Administered 2018-06-01: 6 [IU] via SUBCUTANEOUS

## 2018-06-01 MED ORDER — GUAIFENESIN-DM 100-10 MG/5ML PO SYRP
5.0000 mL | ORAL_SOLUTION | Freq: Four times a day (QID) | ORAL | Status: DC | PRN
  Administered 2018-06-01 – 2018-06-02 (×2): 5 mL via ORAL
  Filled 2018-06-01 (×2): qty 10

## 2018-06-01 NOTE — Progress Notes (Signed)
PROGRESS NOTE    Justin Christensen  JJH:[id number] DOB: 01/15/1931 DOA: 05/21/2018 PCP: [name]  Brief Narrative: 84 year old with past medical history significant for diabetes, mild thrombocytopenia, COPD, pulmonary hypertension, A. fib, on Eliquis, nephrolithiasis, chronic kidney disease who presents at [facility] with hypoglycemia, thought to be secondary to sulfonylurea use.  Patient received IV fluids, subsequently developed volume overload and received IV Lasix.  Foley was placed to monitor urine output and then patient developed delirium, patient self discontinuing catheter which was traumatic.  Catheter was reinserted and he is been having hematuria.  Had 2 g drop of hemoglobin.  Eliquis has been on hold.  Patient was transferred to [facility] long for urology evaluation.  Patient hematuria has resolved.  He has been tolerating Eliquis.  His chronic renal failure has been stable.  For COPD exacerbation he was a started on IV Solu-Medrol.   Assessment & Plan:   Active Problems:   Hypoglycemia   CKD (chronic kidney disease), stage V (HCC)   HTN (hypertension)   AF (paroxysmal atrial fibrillation) (HCC)   COPD (chronic obstructive pulmonary disease) (HCC)   Chronic anemia   Thrombocytopenia (HCC)   BPH (benign prostatic hyperplasia)   Urethral trauma   Pulmonary hypertension, moderate to severe (HCC)   AKI (acute kidney injury) ([place])   Acute blood loss anemia   Acute metabolic encephalopathy   Gross hematuria; Secondary to traumatic Foley removal in the setting of Eliquis. Urology was consulted, per urology on 1215 continue Foley catheter to drain, once urine is clear gel okay to remove Foley catheter and restart Eliquis. Patient continued to have hematuria today urine is darker today.  Repeat hemoglobin in the morning. Hb stable.  Hematuria resolved. catheter removed. Resume eliquis.  Tolerating eliquis.  06/01/18: -Hematuria resolved.  Tolerating Eliquis.  Ordered a.m.  labs.  Anemia of chronic disease, with acute blood loss, from hematuria. His hemoglobin dropped to 6.6 on 12/15.  He received 2 units of packed red blood cell. Hemoglobin has remained stable around 8 since blood transfusion. Hb stable.  06/01/18: -Hemoglobin not checked since 05/29/2018.  Ordered a.m. labs.  Hematuria has resolved at this time.  HTN; continue with hydralazine.  06/01/18: -Continue to monitor blood pressure closely and adjust medications as needed.  Diabetes type 2; uncontrolled; hyperglycemia Increase Lantus to 20 units,. Hyperglycemia likely in the setting of Solu-Medrol. Continue with a sliding scale. 06/01/18: -We will wean Solu-Medrol. -Increase the dose of Lantus.  Continue Accu-Cheks, insulin sliding scale.  Mild thrombocytopenia Improving. 06/01/18: -A.m. labs ordered.  Acute on chronic renal disease, stage V -Cr during hospitalization at [facility] prior to transfer to [place] has been around 3-4 Stable.  Resume low dose lasix.  06/01/18: -Ordered a.m. labs.  Mild hyperkalemia, metabolic acidosis. He received a dose of cardiac silhouette, and bicarb Lokelma dose.  Resume lasix.  05/22/18: -Ordered a.m. labs.  Paroxysmal A. Fib Not on rate control medication Started on Coreg due to elevated blood pressure. 06/01/18: -Continue Coreg.  Continue to monitor blood pressure and adjust dose accordingly.  Restarted on Eliquis.  Tolerating it well.   Acute COPD exacerbation  on home oxygen Continue with schedule nebulizer every 6 hours. 05/22/18: Improving with solumedrol.  Taper down as tolerated.       Estimated body mass index is 27.44 kg/m as calculated from the following:   Height as of this encounter: 5\' 5"  (1.651 m).   Weight as of this encounter: 74.8 kg.   DVT prophylaxis:  Eliquis Code Status: Full Family Communication: No family at bedside Disposition Plan: Will likely need SNF  Consultants:   Urology  Procedures:    None Antimicrobials: None  Subjective: Confused.  On oxygen by nasal cannula.  Denies having any shortness of breath at this time.  Objective: Vitals:   05/31/18 2045 06/01/18 0536 06/01/18 0905 06/01/18 1257  BP: 139/66 (!) 159/85  (!) 173/88  Pulse: 76 85  65  Resp: 20 18  20   Temp: (!) 97.5 F (36.4 C) 98.6 F (37 C)  97.8 F (36.6 C)  TempSrc: Oral Oral  Oral  SpO2: 97% 91% 92% 92%  Weight:      Height:        Intake/Output Summary (Last 24 hours) at 06/01/2018 1259 Last data filed at 06/01/2018 1019 Gross per 24 hour  Intake 855 ml  Output 2000 ml  Net -1145 ml   Filed Weights   05/21/18 1906 05/25/18 0559  Weight: 77.9 kg 74.8 kg    Examination:  General exam: NAD Respiratory system: Occasional wheezing Cardiovascular system: S1, S2 regular rhythm and rate Gastrointestinal system: Bowel sounds present, soft nontender nondistended Central nervous system: Pleasantly confused Extremities; edema improving   Data Reviewed: I have personally reviewed following labs and imaging studies  CBC: Recent Labs  Lab 05/26/18 0445 05/27/18 0409 05/28/18 0405 05/29/18 0519  WBC 6.9 7.0 7.0 12.8*  NEUTROABS 5.5  --   --   --   HGB 9.0* 9.6* 9.4* 9.9*  HCT 30.5* 33.2* 32.3* 33.0*  MCV 107.4* 110.3* 108.4* 107.8*  PLT 139* 143* 140* 782*   Basic Metabolic Panel: Recent Labs  Lab 05/27/18 0409 05/28/18 0405 05/29/18 0519 05/30/18 1504 06/01/18 0357  NA 145 143 140 141 138  K 5.5* 5.3* 5.0 4.6 4.8  CL 109 109 103 102 98  CO2 27 26 19* 29 31  GLUCOSE 158* 140* 309* 159* 225*  BUN 62* 57* 69* 70* 89*  CREATININE 2.35* 2.12* 2.34* 2.26* 2.51*  CALCIUM 9.1 9.0 8.6* 8.8* 8.3*   GFR: Estimated Creatinine Clearance: 20.3 mL/min (A) (by C-G formula based on SCr of 2.51 mg/dL (H)). Liver Function Tests: No results for input(s): AST, ALT, ALKPHOS, BILITOT, PROT, ALBUMIN in the last 168 hours. No results for input(s): LIPASE, AMYLASE in the last 168 hours. No  results for input(s): AMMONIA in the last 168 hours. Coagulation Profile: No results for input(s): INR, PROTIME in the last 168 hours. Cardiac Enzymes: No results for input(s): CKTOTAL, CKMB, CKMBINDEX, TROPONINI in the last 168 hours. BNP (last 3 results) No results for input(s): PROBNP in the last 8760 hours. HbA1C: No results for input(s): HGBA1C in the last 72 hours. CBG: Recent Labs  Lab 05/31/18 1134 05/31/18 1651 05/31/18 2140 06/01/18 0743 06/01/18 1114  GLUCAP 388* 197* 182* 251* 253*   Lipid Profile: No results for input(s): CHOL, HDL, LDLCALC, TRIG, CHOLHDL, LDLDIRECT in the last 72 hours. Thyroid Function Tests: No results for input(s): TSH, T4TOTAL, FREET4, T3FREE, THYROIDAB in the last 72 hours. Anemia Panel: No results for input(s): VITAMINB12, FOLATE, FERRITIN, TIBC, IRON, RETICCTPCT in the last 72 hours. Sepsis Labs: No results for input(s): PROCALCITON, LATICACIDVEN in the last 168 hours.  Recent Results (from the past 240 hour(s))  MRSA PCR Screening     Status: None   Collection Time: 05/25/18  8:53 AM  Result Value Ref Range Status   MRSA by PCR NEGATIVE NEGATIVE Final    Comment:  The GeneXpert MRSA Assay (FDA approved for NASAL specimens only), is one component of a comprehensive MRSA colonization surveillance program. It is not intended to diagnose MRSA infection nor to guide or monitor treatment for MRSA infections. Performed at [facility], [place] [address] [place], [place] [address]          Radiology Studies: No results found.      Scheduled Meds: . amLODipine  5 mg Oral Daily  . apixaban  2.5 mg Oral BID  . atorvastatin  20 mg Oral QHS  . carvedilol  3.125 mg Oral BID WC  . furosemide  40 mg Oral Daily  . hydrALAZINE  50 mg Oral Q8H  . insulin aspart  0-9 Units Subcutaneous TID WC  . insulin glargine  20 Units Subcutaneous Daily  . ipratropium  0.5 mg Nebulization TID  . levalbuterol  1.25 mg  Nebulization TID  . methylPREDNISolone (SOLU-MEDROL) injection  20 mg Intravenous Q12H  . polyethylene glycol  17 g Oral Daily  . senna-docusate  1 tablet Oral BID  . sodium chloride flush  3 mL Intravenous Q12H  . tamsulosin  0.4 mg Oral Daily  . umeclidinium bromide  1 puff Inhalation Daily   Continuous Infusions: . sodium chloride       LOS: 11 days    Time spent: 25 minutes.     Yaakov Guthrie, MD [facility] Pager on amion  If 7PM-7AM, please contact night-coverage [email or web address] Password [facility] 06/01/2018, 12:59 PM

## 2018-06-02 DIAGNOSIS — R310 Gross hematuria: Secondary | ICD-10-CM

## 2018-06-02 DIAGNOSIS — E1122 Type 2 diabetes mellitus with diabetic chronic kidney disease: Secondary | ICD-10-CM

## 2018-06-02 DIAGNOSIS — N183 Chronic kidney disease, stage 3 (moderate): Secondary | ICD-10-CM

## 2018-06-02 DIAGNOSIS — R319 Hematuria, unspecified: Secondary | ICD-10-CM | POA: Diagnosis present

## 2018-06-02 DIAGNOSIS — D649 Anemia, unspecified: Secondary | ICD-10-CM

## 2018-06-02 LAB — CBC
HCT: 33 % — ABNORMAL LOW (ref 39.0–52.0)
Hemoglobin: 10.2 g/dL — ABNORMAL LOW (ref 13.0–17.0)
MCH: 32.2 pg (ref 26.0–34.0)
MCHC: 30.9 g/dL (ref 30.0–36.0)
MCV: 104.1 fL — ABNORMAL HIGH (ref 80.0–100.0)
NRBC: 0 % (ref 0.0–0.2)
Platelets: 181 10*3/uL (ref 150–400)
RBC: 3.17 MIL/uL — ABNORMAL LOW (ref 4.22–5.81)
RDW: 14.9 % (ref 11.5–15.5)
WBC: 12.6 10*3/uL — AB (ref 4.0–10.5)

## 2018-06-02 LAB — GLUCOSE, CAPILLARY
GLUCOSE-CAPILLARY: 325 mg/dL — AB (ref 70–99)
Glucose-Capillary: 198 mg/dL — ABNORMAL HIGH (ref 70–99)
Glucose-Capillary: 286 mg/dL — ABNORMAL HIGH (ref 70–99)
Glucose-Capillary: 372 mg/dL — ABNORMAL HIGH (ref 70–99)

## 2018-06-02 LAB — BASIC METABOLIC PANEL
ANION GAP: 8 (ref 5–15)
BUN: 87 mg/dL — ABNORMAL HIGH (ref 8–23)
CO2: 33 mmol/L — ABNORMAL HIGH (ref 22–32)
Calcium: 8.6 mg/dL — ABNORMAL LOW (ref 8.9–10.3)
Chloride: 99 mmol/L (ref 98–111)
Creatinine, Ser: 2.44 mg/dL — ABNORMAL HIGH (ref 0.61–1.24)
GFR calc Af Amer: 27 mL/min — ABNORMAL LOW (ref >60)
GFR calc non Af Amer: 23 mL/min — ABNORMAL LOW (ref >60)
Glucose, Bld: 221 mg/dL — ABNORMAL HIGH (ref 70–99)
POTASSIUM: 4.9 mmol/L (ref 3.5–5.1)
Sodium: 140 mmol/L (ref 135–145)

## 2018-06-02 MED ORDER — INSULIN ASPART 100 UNIT/ML ~~LOC~~ SOLN
0.0000 [IU] | Freq: Three times a day (TID) | SUBCUTANEOUS | Status: DC
  Administered 2018-06-02: 8 [IU] via SUBCUTANEOUS

## 2018-06-02 MED ORDER — INSULIN ASPART 100 UNIT/ML ~~LOC~~ SOLN
0.0000 [IU] | Freq: Every day | SUBCUTANEOUS | Status: DC
  Administered 2018-06-02: 5 [IU] via SUBCUTANEOUS

## 2018-06-02 NOTE — Progress Notes (Addendum)
Progress Note    Justin Christensen  FHL:[id number] DOB: January 23, 1931  DOA: 05/21/2018 PCP: [name]    Brief Narrative:   Chief complaint: Follow-up hematuria  Medical records reviewed and are as summarized below:  [name] is an 84 y.o. male with a PMH of diabetes, thrombocytopenia, hypertension, COPD, pulmonary hypertension, PAF on Eliquis, BPH, nephrolithiasis, anemia, and stage III CKD who was admitted 05/21/2018 with a chief complaint of hematuria in the setting of 2 recent hospitalizations, 1 4 evaluation of AKI, and the other related to hypoglycemia from sulfonylurea use in the setting of advanced CKD.  Subsequently admitted to [facility] for hypoglycemia and volume overload.  He was catheterized for close fluid monitoring, and overnight on 05/20/2018, removed the catheter traumatically resulting in significant hematuria.  Eliquis was held.  Catheter was reinserted but despite vigorous irrigation, continued to drain bloody urine and was associated with acute blood loss anemia and recurrent AKI.  He subsequently was admitted here for urological consultation.  Urology recommended continuing Foley catheter to drainage and irrigate as needed for clots/obstruction.  On 05/22/2018, he was noted to be confused.  On 05/23/2018, hemoglobin dropped to 6.6 mg/dL and the patient was ordered 2 units of PRBCs.  Posttransfusion hemoglobin was 8.5.  On 05/27/18, the patient's urine cleared sufficiently to allow removal of the catheter.  Eliquis was resumed on 05/28/2018.  On 05/30/2018, low-dose Lasix was resumed when creatinine improved to 2.26.  Assessment/Plan:   Principal Problem:   Urethral trauma/hematuria associated with acute blood loss anemia in the setting of chronic anticoagulation Urology consulted and patient was maintained on Foley catheter drainage with PRN irrigation for blood clots.  He did receive 2 units of PRBCs when his hemoglobin dropped to 6.6 on 05/23/2018.   By 05/27/2018, the patient's urine sufficiently cleared to allow removal of the Foley.  Eliquis subsequently resumed on 05/28/2018.  Hemoglobin now stable at 10.2 mg/dL.  Stable for discharge with regard to this specific issue.  Active Problems:   Diabetes with history of hypoglycemic episodes Patient's blood glucoses over the past 2 days have ranged in the 200s-300s.  Currently being managed with 25 units of Lantus daily with insulin sensitive SSI.  Will change to moderate scale SSI q. before meals and at bedtime.    CKD (chronic kidney disease), stage III-IV (HCC)/AKI Creatinine stable at 2.44, tolerating Lasix.    HTN (hypertension) Blood pressure reasonable, on hydralazine, Norvasc and Coreg.    AF (paroxysmal atrial fibrillation) (HCC) Rate controlled on Coreg, anticoagulated with Eliquis.    COPD (chronic obstructive pulmonary disease) ([place]) with acute exacerbation Patient was placed on Solu-Medrol and has been getting Xopenex, Atrovent, and Ellipta.    Chronic anemia Hemoglobin stable with no evidence of ongoing blood loss from hematuria.    Thrombocytopenia (HCC) Resolved, platelet count WNL today.    BPH (benign prostatic hyperplasia) Good urine output with Foley removal.    Pulmonary hypertension, moderate to severe (HCC) Chronic, stable.    Acute metabolic encephalopathy Appears to be resolved.  Likely related to AKI/possible uremia.     Hyperlipidemia Lipitor substituted for Zocor due to concurrent treatment with Norvasc and risk for myalgia.   Family Communication/Anticipated D/C date and plan/Code Status   DVT prophylaxis: Eliquis ordered. Code Status: Full Code.  Family Communication: Granddaughter by telephone. Disposition Plan: Appears to be awaiting a SNF bed at this point.   Medical Consultants:    Urology   Anti-Infectives:  None   Subjective:   Remains confused, mildly agitated, contradicting granddaughter's account of his HPI. She  says he was confused at home, giving himself too much insulin which he denies.  Says he has been released, and wants to go home. Denies pain, noted to have a moist cough. Says he lives with his daughter, and pays her bills so he will have a place to stay, but "she won't talk" to him or bring him his truck so he can get out of here.  Objective:    Vitals:   06/01/18 1919 06/01/18 2032 06/02/18 0429 06/02/18 0748  BP:  (!) 134/57 (!) 163/76   Pulse:  68 81   Resp:  18 18   Temp:  98 F (36.7 C) 98.2 F (36.8 C)   TempSrc:   Oral   SpO2: 95% 97% 96% 95%  Weight:      Height:        Intake/Output Summary (Last 24 hours) at 06/02/2018 0851 Last data filed at 06/02/2018 0507 Gross per 24 hour  Intake 555 ml  Output 1725 ml  Net -1170 ml   Filed Weights   05/21/18 1906 05/25/18 0559  Weight: 77.9 kg 74.8 kg    Exam: General: Frail, disheveled, appears confused/mildly agitated when on phone with family. Cardiovascular: Heart sounds show a regular rate, and rhythm. No gallops or rubs. No murmurs. No JVD. Lungs: Decreased breath sounds with scattered rhonchi. Abdomen: Soft, nontender, nondistended with normal active bowel sounds. No masses. No hepatosplenomegaly. Neurological: Alert and oriented 2. Moves all extremities 4 with equal strength.  Grossly nonfocal. Skin: Warm and dry. No rashes or lesions. Extremities: No clubbing or cyanosis. No edema. Pedal pulses 2+. Psychiatric: Mood and affect are depressed with mild agitation. Insight and judgment are markedly impaired.   Data Reviewed:   I have personally reviewed following labs and imaging studies:  Labs: Labs show the following:   Basic Metabolic Panel: Recent Labs  Lab 05/28/18 0405 05/29/18 0519 05/30/18 1504 06/01/18 0357 06/02/18 0353  NA 143 140 141 138 140  K 5.3* 5.0 4.6 4.8 4.9  CL 109 103 102 98 99  CO2 26 19* 29 31 33*  GLUCOSE 140* 309* 159* 225* 221*  BUN 57* 69* 70* 89* 87*  CREATININE 2.12*  2.34* 2.26* 2.51* 2.44*  CALCIUM 9.0 8.6* 8.8* 8.3* 8.6*   GFR Estimated Creatinine Clearance: 20.9 mL/min (A) (by C-G formula based on SCr of 2.44 mg/dL (H)).  CBC: Recent Labs  Lab 05/27/18 0409 05/28/18 0405 05/29/18 0519 06/02/18 0353  WBC 7.0 7.0 12.8* 12.6*  HGB 9.6* 9.4* 9.9* 10.2*  HCT 33.2* 32.3* 33.0* 33.0*  MCV 110.3* 108.4* 107.8* 104.1*  PLT 143* 140* 149* 181   CBG: Recent Labs  Lab 05/31/18 2140 06/01/18 0743 06/01/18 1114 06/01/18 1623 06/01/18 2029  GLUCAP 182* 251* 253* 260* 364*    Microbiology Recent Results (from the past 240 hour(s))  MRSA PCR Screening     Status: None   Collection Time: 05/25/18  8:53 AM  Result Value Ref Range Status   MRSA by PCR NEGATIVE NEGATIVE Final    Comment:        The GeneXpert MRSA Assay (FDA approved for NASAL specimens only), is one component of a comprehensive MRSA colonization surveillance program. It is not intended to diagnose MRSA infection nor to guide or monitor treatment for MRSA infections. Performed at [facility], [place] [address] [place], [place] [address]  Procedures and diagnostic studies:  No results found.  Medications:   . amLODipine  5 mg Oral Daily  . apixaban  2.5 mg Oral BID  . atorvastatin  20 mg Oral QHS  . carvedilol  3.125 mg Oral BID WC  . furosemide  40 mg Oral Daily  . hydrALAZINE  50 mg Oral Q8H  . insulin aspart  0-9 Units Subcutaneous TID WC  . insulin glargine  25 Units Subcutaneous Daily  . ipratropium  0.5 mg Nebulization TID  . levalbuterol  1.25 mg Nebulization TID  . methylPREDNISolone (SOLU-MEDROL) injection  20 mg Intravenous Q12H  . polyethylene glycol  17 g Oral Daily  . senna-docusate  1 tablet Oral BID  . sodium chloride flush  3 mL Intravenous Q12H  . tamsulosin  0.4 mg Oral Daily  . umeclidinium bromide  1 puff Inhalation Daily   Continuous Infusions: . sodium chloride       LOS: 12 days     35 minutes spent in the care  of this patient including reviewing and summarizing his hospital stay of 12 days, speaking with his granddaughter, and spending 50% of the total time in face-to-face interaction with the patient with his granddaughter on speaker phone.  Margreta Journey [place]  [facility] Pager [phone]. If unable to reach me by pager, please call my cell phone at [phone].  *Please refer to [email or web address], password [email or web address] to get updated schedule on who will round on this patient, as hospitalists switch teams weekly. If 7PM-7AM, please contact night-coverage at [email or web address], password [email or web address] for any overnight needs.  06/02/2018, 8:51 AM

## 2018-06-03 LAB — GLUCOSE, CAPILLARY
Glucose-Capillary: 208 mg/dL — ABNORMAL HIGH (ref 70–99)
Glucose-Capillary: 259 mg/dL — ABNORMAL HIGH (ref 70–99)
Glucose-Capillary: 260 mg/dL — ABNORMAL HIGH (ref 70–99)
Glucose-Capillary: 288 mg/dL — ABNORMAL HIGH (ref 70–99)

## 2018-06-03 MED ORDER — INSULIN ASPART 100 UNIT/ML ~~LOC~~ SOLN
0.0000 [IU] | Freq: Every day | SUBCUTANEOUS | Status: DC
  Administered 2018-06-03: 3 [IU] via SUBCUTANEOUS
  Administered 2018-06-06: 4 [IU] via SUBCUTANEOUS

## 2018-06-03 MED ORDER — INSULIN ASPART 100 UNIT/ML ~~LOC~~ SOLN
0.0000 [IU] | Freq: Three times a day (TID) | SUBCUTANEOUS | Status: DC
  Administered 2018-06-03: 7 [IU] via SUBCUTANEOUS
  Administered 2018-06-03 (×2): 11 [IU] via SUBCUTANEOUS
  Administered 2018-06-04 (×2): 4 [IU] via SUBCUTANEOUS
  Administered 2018-06-05: 7 [IU] via SUBCUTANEOUS
  Administered 2018-06-06: 11 [IU] via SUBCUTANEOUS
  Administered 2018-06-06 – 2018-06-07 (×2): 7 [IU] via SUBCUTANEOUS
  Administered 2018-06-07: 20 [IU] via SUBCUTANEOUS

## 2018-06-03 MED ORDER — CARVEDILOL 6.25 MG PO TABS
6.2500 mg | ORAL_TABLET | Freq: Two times a day (BID) | ORAL | Status: DC
  Administered 2018-06-03 – 2018-06-10 (×14): 6.25 mg via ORAL
  Filled 2018-06-03 (×15): qty 1

## 2018-06-03 MED ORDER — LORAZEPAM 2 MG/ML IJ SOLN
1.0000 mg | Freq: Three times a day (TID) | INTRAMUSCULAR | Status: DC | PRN
  Administered 2018-06-04: 1 mg via INTRAVENOUS
  Filled 2018-06-03: qty 1

## 2018-06-03 MED ORDER — PREDNISONE 20 MG PO TABS
40.0000 mg | ORAL_TABLET | Freq: Every day | ORAL | Status: DC
  Administered 2018-06-03: 40 mg via ORAL
  Filled 2018-06-03 (×2): qty 2

## 2018-06-03 MED ORDER — INSULIN GLARGINE 100 UNIT/ML ~~LOC~~ SOLN
30.0000 [IU] | Freq: Every day | SUBCUTANEOUS | Status: DC
  Administered 2018-06-03 – 2018-06-06 (×3): 30 [IU] via SUBCUTANEOUS
  Filled 2018-06-03 (×6): qty 0.3

## 2018-06-03 NOTE — Progress Notes (Signed)
PT Cancellation Note  Patient Details Name: DUONG HAYDEL MRN: [id number] DOB: 07-02-1930   Cancelled Treatment:    Reason Eval/Treat Not Completed: Patient declined, patient stated that he had had enough of rehab. He was going home. RN attempted encouragement to participate to no avail.  Claretha Cooper 06/03/2018, 2:18 PM  [place] Pager [phone] Office [phone]

## 2018-06-03 NOTE — Progress Notes (Signed)
Progress Note    Justin Christensen  QJJ:[id number] DOB: 24-Aug-1930  DOA: 05/21/2018 PCP: [name]    Brief Narrative:   Chief complaint: Follow-up hematuria  Medical records reviewed and are as summarized below:  Justin Christensen is an 84 y.o. male with a PMH of diabetes, thrombocytopenia, hypertension, COPD, pulmonary hypertension, PAF on Eliquis, BPH, nephrolithiasis, anemia, and stage III CKD who was admitted 05/21/2018 with a chief complaint of hematuria in the setting of 2 recent hospitalizations, 1 4 evaluation of AKI, and the other related to hypoglycemia from sulfonylurea use in the setting of advanced CKD.  Subsequently admitted to [facility] for hypoglycemia and volume overload.  He was catheterized for close fluid monitoring, and overnight on 05/20/2018, removed the catheter traumatically resulting in significant hematuria.  Eliquis was held.  Catheter was reinserted but despite vigorous irrigation, continued to drain bloody urine and was associated with acute blood loss anemia and recurrent AKI.  He subsequently was admitted here for urological consultation.  Urology recommended continuing Foley catheter to drainage and irrigate as needed for clots/obstruction.  On 05/22/2018, he was noted to be confused.  On 05/23/2018, hemoglobin dropped to 6.6 mg/dL and the patient was ordered 2 units of PRBCs.  Posttransfusion hemoglobin was 8.5.  On 05/27/18, the patient's urine cleared sufficiently to allow removal of the catheter.  Eliquis was resumed on 05/28/2018.  On 05/30/2018, low-dose Lasix was resumed when creatinine improved to 2.26.  Assessment/Plan:   Principal Problem:   Urethral trauma/hematuria associated with acute blood loss anemia in the setting of chronic anticoagulation Urology consulted and patient was maintained on Foley catheter drainage with PRN irrigation for blood clots.  He did receive 2 units of PRBCs when his hemoglobin dropped to 6.6 on 05/23/2018.   By 05/27/2018, the patient's urine sufficiently cleared to allow removal of the Foley.  Eliquis subsequently resumed on 05/28/2018.  Hemoglobin now stable at 10.2 mg/dL.  Stable for discharge with regard to this specific issue.  Active Problems:   Diabetes with history of hypoglycemic episodes Patient's blood glucoses over the past 2 days have ranged in the 200s-300s and continue to be elevated despite adjusting his insulin regimen yesterday.  Currently being managed with 25 units of Lantus daily with moderate scale SSI Q AC/HS.  Will change to resistant scale SSI q. before meals and at bedtime, and increase Lantus to 30 units.    CKD (chronic kidney disease), stage III-IV (HCC)/AKI Creatinine stable at 2.44, tolerating Lasix.    HTN (hypertension) Systolic blood pressure 481 this morning, on hydralazine, Norvasc and Coreg.  Will increase Coreg to 6.25.    AF (paroxysmal atrial fibrillation) (HCC) Rate controlled on Coreg, anticoagulated with Eliquis.    COPD (chronic obstructive pulmonary disease) ([place]) with acute exacerbation Patient was placed on Solu-Medrol and has been getting Xopenex, Atrovent, and Ellipta.  Will discontinue Solu-Medrol and place on prednisone with taper.    Chronic anemia Hemoglobin stable with no evidence of ongoing blood loss from hematuria.    Thrombocytopenia (HCC) Resolved, platelet count WNL.    BPH (benign prostatic hyperplasia) Good urine output with Foley removal.    Pulmonary hypertension, moderate to severe (HCC) Chronic, stable.    Acute metabolic encephalopathy Appears to be resolved.  Likely related to AKI/possible uremia.     Hyperlipidemia Lipitor substituted for Zocor due to concurrent treatment with Norvasc and risk for myalgia.   Family Communication/Anticipated D/C date and plan/Code Status   DVT prophylaxis:  Eliquis ordered. Code Status: Full Code.  Family Communication: Daughter not reachable by telephone, mailbox full and unable  to leave a message. Disposition Plan: Appears to be awaiting a SNF bed at this point.   Medical Consultants:    Urology   Anti-Infectives:    None   Subjective:   Continues to be fixated on leaving with poor insight into reason for hospitalization.  "I'm gonna go, this ain't doing me no good.  I have to get my truck and clean it."  Denies pain, SOB, constipation, N/V.  Appetite OK.  Objective:    Vitals:   06/02/18 1826 06/02/18 2025 06/02/18 2208 06/03/18 0410  BP:   132/68 (!) 171/84  Pulse: 84  86 70  Resp:   18 18  Temp:   98.3 F (36.8 C) 98.1 F (36.7 C)  TempSrc:    Oral  SpO2:  93% 93% 93%  Weight:      Height:        Intake/Output Summary (Last 24 hours) at 06/03/2018 0739 Last data filed at 06/03/2018 0427 Gross per 24 hour  Intake 1143 ml  Output 2000 ml  Net -857 ml   Filed Weights   05/21/18 1906 05/25/18 0559  Weight: 77.9 kg 74.8 kg    Exam: General: Frail, confused. Cardiovascular: Heart sounds show a regular rate, and rhythm. No gallops or rubs. No murmurs. No JVD. Lungs: Rhonchi on right. Abdomen: Soft, nontender, nondistended with normal active bowel sounds. No masses. No hepatosplenomegaly. Skin: Warm and dry. No rashes or lesions. Extremities: No clubbing or cyanosis. No edema. Pedal pulses 2+. Psychiatric: Mood and affect are anxious. Insight and judgment are poor.   Data Reviewed:   I have personally reviewed following labs and imaging studies:  Labs: Labs show the following:   Basic Metabolic Panel: Recent Labs  Lab 05/28/18 0405 05/29/18 0519 05/30/18 1504 06/01/18 0357 06/02/18 0353  NA 143 140 141 138 140  K 5.3* 5.0 4.6 4.8 4.9  CL 109 103 102 98 99  CO2 26 19* 29 31 33*  GLUCOSE 140* 309* 159* 225* 221*  BUN 57* 69* 70* 89* 87*  CREATININE 2.12* 2.34* 2.26* 2.51* 2.44*  CALCIUM 9.0 8.6* 8.8* 8.3* 8.6*   GFR Estimated Creatinine Clearance: 20.9 mL/min (A) (by C-G formula based on SCr of 2.44 mg/dL  (H)).  CBC: Recent Labs  Lab 05/28/18 0405 05/29/18 0519 06/02/18 0353  WBC 7.0 12.8* 12.6*  HGB 9.4* 9.9* 10.2*  HCT 32.3* 33.0* 33.0*  MCV 108.4* 107.8* 104.1*  PLT 140* 149* 181   CBG: Recent Labs  Lab 06/01/18 2029 06/02/18 0738 06/02/18 1149 06/02/18 1619 06/02/18 2206  GLUCAP 364* 198* 325* 286* 372*    Microbiology Recent Results (from the past 240 hour(s))  MRSA PCR Screening     Status: None   Collection Time: 05/25/18  8:53 AM  Result Value Ref Range Status   MRSA by PCR NEGATIVE NEGATIVE Final    Comment:        The GeneXpert MRSA Assay (FDA approved for NASAL specimens only), is one component of a comprehensive MRSA colonization surveillance program. It is not intended to diagnose MRSA infection nor to guide or monitor treatment for MRSA infections. Performed at [facility], [place] [address] [place], [place] [address]     Procedures and diagnostic studies:  No results found.  Medications:   . amLODipine  5 mg Oral Daily  . apixaban  2.5 mg Oral BID  .  atorvastatin  20 mg Oral QHS  . carvedilol  3.125 mg Oral BID WC  . furosemide  40 mg Oral Daily  . hydrALAZINE  50 mg Oral Q8H  . insulin aspart  0-15 Units Subcutaneous TID WC  . insulin aspart  0-5 Units Subcutaneous QHS  . insulin glargine  25 Units Subcutaneous Daily  . ipratropium  0.5 mg Nebulization TID  . levalbuterol  1.25 mg Nebulization TID  . methylPREDNISolone (SOLU-MEDROL) injection  20 mg Intravenous Q12H  . polyethylene glycol  17 g Oral Daily  . senna-docusate  1 tablet Oral BID  . sodium chloride flush  3 mL Intravenous Q12H  . tamsulosin  0.4 mg Oral Daily  . umeclidinium bromide  1 puff Inhalation Daily   Continuous Infusions: . sodium chloride       LOS: 13 days      Jacquelynn Cree  [facility] Pager [phone]. If unable to reach me by pager, please call my cell phone at [phone].  *Please refer to [email or web address], password  [email or web address] to get updated schedule on who will round on this patient, as hospitalists switch teams weekly. If 7PM-7AM, please contact night-coverage at [email or web address], password [email or web address] for any overnight needs.  06/03/2018, 7:39 AM

## 2018-06-03 NOTE — Plan of Care (Signed)

## 2018-06-03 NOTE — Care Management Important Message (Signed)
Important Message  Patient Details  Name: Justin Christensen MRN: [id number] Date of Birth: 02/26/1931   Medicare Important Message Given:  Yes    [name] 06/03/2018, 9:07 AM

## 2018-06-03 NOTE — Progress Notes (Signed)
Left voicemails today with [facility] and SNF to inquire as to status of [facility] contract approval for admission to [facility].  Sharren Bridge, MSW, LCSW Clinical Social Work 06/03/2018 [phone]

## 2018-06-04 DIAGNOSIS — D649 Anemia, unspecified: Secondary | ICD-10-CM

## 2018-06-04 DIAGNOSIS — F0391 Unspecified dementia with behavioral disturbance: Secondary | ICD-10-CM | POA: Diagnosis present

## 2018-06-04 DIAGNOSIS — N183 Chronic kidney disease, stage 3 (moderate): Secondary | ICD-10-CM

## 2018-06-04 DIAGNOSIS — F03918 Unspecified dementia, unspecified severity, with other behavioral disturbance: Secondary | ICD-10-CM | POA: Diagnosis present

## 2018-06-04 DIAGNOSIS — R310 Gross hematuria: Secondary | ICD-10-CM

## 2018-06-04 DIAGNOSIS — S3730XA Unspecified injury of urethra, initial encounter: Principal | ICD-10-CM

## 2018-06-04 DIAGNOSIS — E1122 Type 2 diabetes mellitus with diabetic chronic kidney disease: Secondary | ICD-10-CM

## 2018-06-04 DIAGNOSIS — I10 Essential (primary) hypertension: Secondary | ICD-10-CM

## 2018-06-04 DIAGNOSIS — N185 Chronic kidney disease, stage 5: Secondary | ICD-10-CM

## 2018-06-04 LAB — CREATININE, SERUM
Creatinine, Ser: 2.17 mg/dL — ABNORMAL HIGH (ref 0.61–1.24)
GFR calc Af Amer: 31 mL/min — ABNORMAL LOW (ref >60)
GFR calc non Af Amer: 27 mL/min — ABNORMAL LOW (ref >60)

## 2018-06-04 LAB — GLUCOSE, CAPILLARY
GLUCOSE-CAPILLARY: 166 mg/dL — AB (ref 70–99)
Glucose-Capillary: 116 mg/dL — ABNORMAL HIGH (ref 70–99)
Glucose-Capillary: 99 mg/dL (ref 70–99)
Glucose-Capillary: 65 mg/dL — ABNORMAL LOW (ref 70–99)
Glucose-Capillary: 114 mg/dL — ABNORMAL HIGH (ref 70–99)

## 2018-06-04 MED ORDER — DEXTROSE 50 % IV SOLN
12.5000 g | INTRAVENOUS | Status: AC
  Administered 2018-06-04: 12.5 g via INTRAVENOUS
  Filled 2018-06-04: qty 50

## 2018-06-04 MED ORDER — PREDNISONE 20 MG PO TABS
30.0000 mg | ORAL_TABLET | Freq: Every day | ORAL | Status: DC
  Administered 2018-06-04 – 2018-06-05 (×2): 30 mg via ORAL
  Filled 2018-06-04: qty 1

## 2018-06-04 MED ORDER — INSULIN ASPART 100 UNIT/ML ~~LOC~~ SOLN
4.0000 [IU] | Freq: Three times a day (TID) | SUBCUTANEOUS | Status: DC
  Administered 2018-06-04 (×2): 4 [IU] via SUBCUTANEOUS

## 2018-06-04 NOTE — Progress Notes (Signed)
Patient agitated and wanting to leave. Patient only orientated to self and some family members. Patient using foul language towards staff. Ativan given.

## 2018-06-04 NOTE — Progress Notes (Addendum)
Progress Note    Justin Christensen  BSW:[id number] DOB: 03-10-1931  DOA: 05/21/2018 PCP: [name]    Brief Narrative:   Chief complaint: Follow-up hematuria  Medical records reviewed and are as summarized below:  Justin Christensen is an 84 y.o. male with a PMH of diabetes, thrombocytopenia, hypertension, COPD, pulmonary hypertension, PAF on Eliquis, BPH, nephrolithiasis, anemia, and stage III CKD who was admitted 05/21/2018 with a chief complaint of hematuria in the setting of 2 recent hospitalizations, 1 4 evaluation of AKI, and the other related to hypoglycemia from sulfonylurea use in the setting of advanced CKD.  Subsequently admitted to [facility] for hypoglycemia and volume overload.  He was catheterized for close fluid monitoring, and overnight on 05/20/2018, removed the catheter traumatically resulting in significant hematuria.  Eliquis was held.  Catheter was reinserted but despite vigorous irrigation, continued to drain bloody urine and was associated with acute blood loss anemia and recurrent AKI.  He subsequently was admitted here for urological consultation.  Urology recommended continuing Foley catheter to drainage and irrigate as needed for clots/obstruction.  On 05/22/2018, he was noted to be confused.  On 05/23/2018, hemoglobin dropped to 6.6 mg/dL and the patient was ordered 2 units of PRBCs.  Posttransfusion hemoglobin was 8.5.  On 05/27/18, the patient's urine cleared sufficiently to allow removal of the catheter.  Eliquis was resumed on 05/28/2018.  On 05/30/2018, low-dose Lasix was resumed when creatinine improved to 2.26.  At this point, the patient remained stable for discharge, but there is a delay in obtaining [place] authorization for SNF placement.  Social worker anticipates we will be unable to place until 06/07/2018.  Assessment/Plan:   Principal Problem:   Urethral trauma/hematuria associated with acute blood loss anemia in the setting of chronic  anticoagulation Urology consulted and patient was maintained on Foley catheter drainage with PRN irrigation for blood clots.  He did receive 2 units of PRBCs when his hemoglobin dropped to 6.6 on 05/23/2018.  By 05/27/2018, the patient's urine sufficiently cleared to allow removal of the Foley.  Eliquis subsequently resumed on 05/28/2018.  Hemoglobin now stable at 10.2 mg/dL.  Stable for discharge with regard to this specific issue.  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  We continue to await authorization for SNF placement. This patient has been assessed and is determined to have/lack capacity based on the following criteria: He does not understand his treatment/care options or how it applies to his condition, he does not understand the risk or benefits associated with treatment options, he does not understand the potential consequences associated with refusing treatment and his decision-making capacity appears to be impaired.  He continues to assert that he can manage his affairs at home when it is clear that he has significant physical debility.    Diabetes with history of hypoglycemic episodes CBGs 208-288 over the past 24 hours.  Currently being managed with 30 units of Lantus daily with persistent scale SSI Q AC/HS. Will add 4 units of meal coverage.    CKD (chronic kidney disease), stage III-IV (HCC)/AKI Creatinine stable at 2.17, tolerating Lasix.    HTN (hypertension) Continue hydralazine, Norvasc and Coreg.  Coreg dose increased to 6.25 twice daily on 06/03/2018.    AF (paroxysmal atrial fibrillation) (HCC) Rate controlled on Coreg, anticoagulated with Eliquis.    COPD (chronic obstructive pulmonary disease) (HCC) with acute exacerbation Patient was placed on Solu-Medrol and has been  getting Xopenex, Atrovent, and Ellipta.  Solu-Medrol discontinued and placed on prednisone 40 mg daily on 06/03/2018.   Decrease prednisone to 30 mg today, and taper further as tolerated.    Chronic anemia Hemoglobin stable with no evidence of ongoing blood loss from hematuria.    Thrombocytopenia (HCC) Resolved, platelet count WNL.    BPH (benign prostatic hyperplasia) Good urine output with Foley removal.    Pulmonary hypertension, moderate to severe (HCC) Chronic, stable.    Acute metabolic encephalopathy Appears to be resolved.  Likely related to AKI/possible uremia.     Hyperlipidemia Lipitor substituted for Zocor due to concurrent treatment with Norvasc and risk for myalgia.  Will need to make this change at discharge.   Family Communication/Anticipated D/C date and plan/Code Status   DVT prophylaxis: Eliquis ordered. Code Status: Full Code.  Family Communication: Daughter not reachable by telephone, mailbox full and unable to leave a message. Disposition Plan: Continues to await auth for SNF, stable for discharge.   Medical Consultants:    Urology   Anti-Infectives:    None   Subjective:   Remains upset over delay in discharge. Bowels moved this morning. No nausea, appetite good. Refused to work with PT 06/03/18.    Objective:    Vitals:   06/03/18 2036 06/03/18 2114 06/03/18 2144 06/04/18 0418  BP: (!) 117/49  (!) 142/52 (!) 150/95  Pulse: 85  66 80  Resp: 18   20  Temp: 97.8 F (36.6 C)   98.1 F (36.7 C)  TempSrc: Oral   Oral  SpO2: 98% 97%  100%  Weight:      Height:        Intake/Output Summary (Last 24 hours) at 06/04/2018 0750 Last data filed at 06/04/2018 0430 Gross per 24 hour  Intake 720 ml  Output 1700 ml  Net -980 ml   Filed Weights   05/21/18 1906 05/25/18 0559  Weight: 77.9 kg 74.8 kg    Exam: General: Frail, still irritable about not being discharged. Cardiovascular: Heart sounds show a regular rate, and rhythm. No gallops or rubs. No murmurs. No JVD. Lungs: Rhonchi on right. Abdomen: Soft, nontender, nondistended with normal active  bowel sounds. No masses. No hepatosplenomegaly. Skin: Warm and dry. No rashes or lesions. Extremities: No clubbing or cyanosis. No edema. Pedal pulses 2+.  Data Reviewed:   I have personally reviewed following labs and imaging studies:  Labs: Labs show the following:   Basic Metabolic Panel: Recent Labs  Lab 05/29/18 0519 05/30/18 1504 06/01/18 0357 06/02/18 0353 06/04/18 0424  NA 140 141 138 140  --   K 5.0 4.6 4.8 4.9  --   CL 103 102 98 99  --   CO2 19* 29 31 33*  --   GLUCOSE 309* 159* 225* 221*  --   BUN 69* 70* 89* 87*  --   CREATININE 2.34* 2.26* 2.51* 2.44* 2.17*  CALCIUM 8.6* 8.8* 8.3* 8.6*  --    GFR Estimated Creatinine Clearance: 23.5 mL/min (A) (by C-G formula based on SCr of 2.17 mg/dL (H)).  CBC: Recent Labs  Lab 05/29/18 0519 06/02/18 0353  WBC 12.8* 12.6*  HGB 9.9* 10.2*  HCT 33.0* 33.0*  MCV 107.8* 104.1*  PLT 149* 181   CBG: Recent Labs  Lab 06/02/18 2206 06/03/18 0729 06/03/18 1136 06/03/18 1730 06/03/18 2031  GLUCAP 372* 208* 259* 260* 288*    Microbiology Recent Results (from the past 240 hour(s))  MRSA PCR Screening     Status:  None   Collection Time: 05/25/18  8:53 AM  Result Value Ref Range Status   MRSA by PCR NEGATIVE NEGATIVE Final    Comment:        The GeneXpert MRSA Assay (FDA approved for NASAL specimens only), is one component of a comprehensive MRSA colonization surveillance program. It is not intended to diagnose MRSA infection nor to guide or monitor treatment for MRSA infections. Performed at [facility], [place] [address] [place], [place] [address]     Procedures and diagnostic studies:  No results found.  Medications:   . amLODipine  5 mg Oral Daily  . apixaban  2.5 mg Oral BID  . atorvastatin  20 mg Oral QHS  . carvedilol  6.25 mg Oral BID WC  . furosemide  40 mg Oral Daily  . hydrALAZINE  50 mg Oral Q8H  . insulin aspart  0-20 Units Subcutaneous TID WC  . insulin aspart   0-5 Units Subcutaneous QHS  . insulin glargine  30 Units Subcutaneous Daily  . ipratropium  0.5 mg Nebulization TID  . levalbuterol  1.25 mg Nebulization TID  . polyethylene glycol  17 g Oral Daily  . predniSONE  40 mg Oral Q breakfast  . senna-docusate  1 tablet Oral BID  . sodium chloride flush  3 mL Intravenous Q12H  . tamsulosin  0.4 mg Oral Daily  . umeclidinium bromide  1 puff Inhalation Daily   Continuous Infusions: . sodium chloride       LOS: 14 days      Jacquelynn Cree  [facility] Pager [phone]. If unable to reach me by pager, please call my cell phone at [phone].  *Please refer to [email or web address], password [email or web address] to get updated schedule on who will round on this patient, as hospitalists switch teams weekly. If 7PM-7AM, please contact night-coverage at [email or web address], password [email or web address] for any overnight needs.  06/04/2018, 7:50 AM

## 2018-06-04 NOTE — Progress Notes (Signed)
Pt HS CBG was 65, following protocol put in order for 12.5 dextrose, waiting for pharmacy verification. Will continue to monitor

## 2018-06-04 NOTE — Progress Notes (Signed)
After giving pt 12.5 of dextrose per rpotocol, cbg recheck is 114. Will continue to monitor patient

## 2018-06-04 NOTE — Progress Notes (Signed)
Spoke with [facility], has submitted contract for SNF [facility]), will continue to await processing in order to proceed with pt's admission there.  Sharren Bridge, MSW, LCSW Clinical Social Work 06/04/2018 [phone]

## 2018-06-04 NOTE — Progress Notes (Signed)
Physical Therapy Treatment Patient Details Name: Justin Christensen MRN: [id number] DOB: 07/27/1930 Today's Date: 06/04/2018    History of Present Illness 84 y.o. male admitted with hypoglycemia, confusion, and hematuria. PMH significant for DM, mild thrombocytopenia, HTN, COPD, pulmonary HTN, atrial fibrillation on eliquis, nephrolithiasis, CKD and anemia.    PT Comments    Pt only agreeable to OOB to recliner with some encouragement.  Pt remains on 2L O2 [place] and SPO2 95% at rest and 91% after transferring.  Pt reports he does not wear oxygen at home.  Pt perseverating on getting his trunk back during session.   Follow Up Recommendations  SNF     Equipment Recommendations  Rolling walker with 5" wheels    Recommendations for Other Services       Precautions / Restrictions Precautions Precautions: Fall Precaution Comments: currently on oxygen, pt reports he does not wear O2 at baseline    Mobility  Bed Mobility Overal bed mobility: Needs Assistance Bed Mobility: Supine to Sit     Supine to sit: Min assist;HOB elevated     General bed mobility comments: assist for trunk upright  Transfers Overall transfer level: Needs assistance Equipment used: None Transfers: Sit to/from Stand Sit to Stand: Min guard         General transfer comment: pt utilized bed rail and armrests on recliner, declined holding RW, min/guard for safety, pt reports "I can do it, you don't need to hold on" SPO2 91% on 2L O2 [place] after transfer  Ambulation/Gait             General Gait Details: pt declined, only agreeable to OOB to recliner   Stairs             Wheelchair Mobility    Modified Rankin (Stroke Patients Only)       Balance                                            Cognition Arousal/Alertness: Awake/alert Behavior During Therapy: WFL for tasks assessed/performed Overall Cognitive Status: Impaired/Different from baseline                        Memory: Decreased short-term memory;Decreased recall of precautions         General Comments: pt reports he wants to d/c, however perseverating on getting his trunk back      Exercises      General Comments        Pertinent Vitals/Pain Pain Assessment: No/denies pain Pain Intervention(s): Monitored during session;Repositioned    Home Living                      Prior Function            PT Goals (current goals can now be found in the care plan section) Progress towards PT goals: Progressing toward goals    Frequency    Min 2X/week      PT Plan Current plan remains appropriate    Co-evaluation              AM-PAC PT "6 Clicks" Mobility   Outcome Measure  Help needed turning from your back to your side while in a flat bed without using bedrails?: A Little Help needed moving from lying on your back to sitting on the side of a flat  bed without using bedrails?: A Little Help needed moving to and from a bed to a chair (including a wheelchair)?: A Little Help needed standing up from a chair using your arms (e.g., wheelchair or bedside chair)?: A Little Help needed to walk in hospital room?: A Lot Help needed climbing 3-5 steps with a railing? : A Lot 6 Click Score: 16    End of Session Equipment Utilized During Treatment: Gait belt;Oxygen Activity Tolerance: Patient limited by fatigue Patient left: in chair;with call bell/phone within reach;with chair alarm set Nurse Communication: Mobility status PT Visit Diagnosis: Muscle weakness (generalized) (M62.81);Difficulty in walking, not elsewhere classified (R26.2)     Time: 9539-6728 PT Time Calculation (min) (ACUTE ONLY): 20 min  Charges:  $Therapeutic Activity: 8-22 mins           Carmelia Bake, PT, DPT Acute Rehabilitation Services Office: [phone] Pager: [phone]  Trena Platt 06/04/2018, 12:50 PM

## 2018-06-05 DIAGNOSIS — S3730XS Unspecified injury of urethra, sequela: Secondary | ICD-10-CM

## 2018-06-05 LAB — GLUCOSE, CAPILLARY
GLUCOSE-CAPILLARY: 209 mg/dL — AB (ref 70–99)
Glucose-Capillary: 45 mg/dL — ABNORMAL LOW (ref 70–99)
Glucose-Capillary: 88 mg/dL (ref 70–99)
Glucose-Capillary: 231 mg/dL — ABNORMAL HIGH (ref 70–99)
Glucose-Capillary: 121 mg/dL — ABNORMAL HIGH (ref 70–99)
Glucose-Capillary: 155 mg/dL — ABNORMAL HIGH (ref 70–99)

## 2018-06-05 MED ORDER — GLUCOSE 40 % PO GEL
2.0000 | ORAL | Status: AC
  Administered 2018-06-05: 75 g via ORAL
  Filled 2018-06-05: qty 2

## 2018-06-05 NOTE — Progress Notes (Signed)
Writer checked pt cbg, was 45, following protocal, waiting for verification of dextrose 40% oral gel as patient is alert and talking to me. Will continue to monitor

## 2018-06-05 NOTE — Progress Notes (Signed)
Progress Note    Justin Christensen  BOF:[id number] DOB: 1931-03-10  DOA: 05/21/2018 PCP: [name]    Brief Narrative:   Chief complaint: Follow-up hematuria  Medical records reviewed and are as summarized below:  Justin Christensen is an 84 y.o. male with a PMH of diabetes, thrombocytopenia, hypertension, COPD, pulmonary hypertension, PAF on Eliquis, BPH, nephrolithiasis, anemia, and stage III CKD who was admitted 05/21/2018 with a chief complaint of hematuria in the setting of 2 recent hospitalizations, 1 4 evaluation of AKI, and the other related to hypoglycemia from sulfonylurea use in the setting of advanced CKD.  Subsequently admitted to [facility] for hypoglycemia and volume overload.  He was catheterized for close fluid monitoring, and overnight on 05/20/2018, removed the catheter traumatically resulting in significant hematuria.  Eliquis was held.  Catheter was reinserted but despite vigorous irrigation, continued to drain bloody urine and was associated with acute blood loss anemia and recurrent AKI.  He subsequently was admitted here for urological consultation.  Urology recommended continuing Foley catheter to drainage and irrigate as needed for clots/obstruction.  On 05/22/2018, he was noted to be confused.  On 05/23/2018, hemoglobin dropped to 6.6 mg/dL and the patient was ordered 2 units of PRBCs.  Posttransfusion hemoglobin was 8.5.  On 05/27/18, the patient's urine cleared sufficiently to allow removal of the catheter.  Eliquis was resumed on 05/28/2018.  On 05/30/2018, low-dose Lasix was resumed when creatinine improved to 2.26.  At this point, the patient remained stable for discharge, but there is a delay in obtaining [place] authorization for SNF placement.  Social worker anticipates we will be unable to place until 06/07/2018.  Ongoing intermittent confusion.  Developed hypoglycemia overnight 12/27.  Assessment/Plan:   Principal Problem:   Urethral trauma/hematuria  associated with acute blood loss anemia in the setting of chronic anticoagulation Urology consulted and patient was maintained on Foley catheter drainage with PRN irrigation for blood clots.  He did receive 2 units of PRBCs when his hemoglobin dropped to 6.6 on 05/23/2018.  By 05/27/2018, the patient's urine sufficiently cleared to allow removal of the Foley.  Eliquis subsequently resumed on 05/28/2018.  Hemoglobin now stable at 10.2 mg/dL.  Stable for discharge with regard to this specific issue.  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  We continue to await authorization for SNF placement. This patient has been assessed and is determined to have/lack capacity based on the following criteria: He does not understand his treatment/care options or how it applies to his condition, he does not understand the risk or benefits associated with treatment options, he does not understand the potential consequences associated with refusing treatment and his decision-making capacity appears to be impaired.  He continues to assert that he can manage his affairs at home when it is clear that he has significant physical debility. As per RN report, had confusion/agitation overnight requiring PRN Ativan.  Pleasantly confused this morning but no agitation.    Diabetes with history of hypoglycemic episodes Currently being managed with 30 units of Lantus daily with persistent scale SSI Q AC/HS. Due to persistent hyperglycemia, NovoLog mealtime 4 units had been added yesterday but patient developed severe hypoglycemia/45 g per DL on 12/28.  Lantus held this morning, NovoLog mealtime discontinued.  For now continue SSI and will need to reinitiate Lantus in a.m. at same prior dose or reduced dose.    CKD (chronic kidney disease), stage III-IV (  HCC)/AKI Creatinine stable at 2.17, tolerating Lasix.  Follow BMP in a.m.    HTN (hypertension) Continue  hydralazine, Norvasc and Coreg.  Coreg dose increased to 6.25 twice daily on 06/03/2018.  Reasonable control.    AF (paroxysmal atrial fibrillation) (HCC) Rate controlled on Coreg, anticoagulated with Eliquis.    COPD (chronic obstructive pulmonary disease) ([place]) with acute exacerbation Patient was placed on Solu-Medrol and has been getting Xopenex, Atrovent, and Ellipta.  Solu-Medrol discontinued and placed on prednisone 40 mg daily on 06/03/2018.  Continue to taper prednisone.  This may also be decreasing his insulin requirement.    Chronic anemia Hemoglobin stable with no evidence of ongoing blood loss from hematuria.    Thrombocytopenia (HCC) Resolved, platelet count WNL.    BPH (benign prostatic hyperplasia) Good urine output with Foley removal.    Pulmonary hypertension, moderate to severe (HCC) Chronic, stable.    Acute metabolic encephalopathy Likely related to AKI/possible uremia complicating underlying dementia.  Intermittent confusion ongoing.     Hyperlipidemia Lipitor substituted for Zocor due to concurrent treatment with Norvasc and risk for myalgia.  Will need to make this change at discharge.   Family Communication/Anticipated D/C date and plan/Code Status   DVT prophylaxis: Eliquis ordered. Code Status: Full Code.  Family Communication: None at bedside. Disposition Plan: Continues to await auth for SNF, stable for discharge.   Medical Consultants:    Urology   Anti-Infectives:    None   Subjective:   Pleasantly confused.  Oriented only to self.  As per RN, required Ativan overnight.  He was calling for police.  Hypoglycemia noted overnight, Lantus held this morning.  Objective:    Vitals:   06/05/18 0815 06/05/18 1412 06/05/18 1452 06/05/18 1718  BP:   (!) 110/47 135/86  Pulse:   85 88  Resp:   18   Temp:   97.8 F (36.6 C)   TempSrc:   Oral   SpO2: 98% 95% 95%   Weight:      Height:        Intake/Output Summary (Last 24 hours) at  06/05/2018 1816 Last data filed at 06/05/2018 1459 Gross per 24 hour  Intake 820 ml  Output 975 ml  Net -155 ml   Filed Weights   05/21/18 1906 05/25/18 0559  Weight: 77.9 kg 74.8 kg    Exam: General: Patient sitting up comfortably in bed without distress. Cardiovascular: Heart sounds show a regular rate, and rhythm. No gallops or rubs. No murmurs. No JVD.  Stable. Lungs: Slightly harsh breath sounds with occasional rhonchi.  No increased work of breathing Abdomen: Soft, nontender, nondistended with normal active bowel sounds. No masses. No hepatosplenomegaly.  Stable. Skin: Warm and dry. No rashes or lesions. Extremities: No clubbing or cyanosis. No edema. Pedal pulses 2+. CNS: Alert and oriented only to self.  No focal neurological deficits.  Data Reviewed:   I have personally reviewed following labs and imaging studies:  Labs: Labs show the following:   Basic Metabolic Panel: Recent Labs  Lab 05/30/18 1504 06/01/18 0357 06/02/18 0353 06/04/18 0424  NA 141 138 140  --   K 4.6 4.8 4.9  --   CL 102 98 99  --   CO2 29 31 33*  --   GLUCOSE 159* 225* 221*  --   BUN 70* 89* 87*  --   CREATININE 2.26* 2.51* 2.44* 2.17*  CALCIUM 8.8* 8.3* 8.6*  --    GFR Estimated Creatinine Clearance: 23.5 mL/min (A) (by C-G  formula based on SCr of 2.17 mg/dL (H)).  CBC: Recent Labs  Lab 06/02/18 0353  WBC 12.6*  HGB 10.2*  HCT 33.0*  MCV 104.1*  PLT 181   CBG: Recent Labs  Lab 06/05/18 0412 06/05/18 0501 06/05/18 0733 06/05/18 1152 06/05/18 1613  GLUCAP 45* 88 209* 231* 121*    Microbiology No results found for this or any previous visit (from the past 240 hour(s)).  Procedures and diagnostic studies:  No results found.  Medications:   . amLODipine  5 mg Oral Daily  . apixaban  2.5 mg Oral BID  . atorvastatin  20 mg Oral QHS  . carvedilol  6.25 mg Oral BID WC  . furosemide  40 mg Oral Daily  . hydrALAZINE  50 mg Oral Q8H  . insulin aspart  0-20 Units  Subcutaneous TID WC  . insulin aspart  0-5 Units Subcutaneous QHS  . insulin glargine  30 Units Subcutaneous Daily  . ipratropium  0.5 mg Nebulization TID  . levalbuterol  1.25 mg Nebulization TID  . polyethylene glycol  17 g Oral Daily  . predniSONE  30 mg Oral Q breakfast  . senna-docusate  1 tablet Oral BID  . sodium chloride flush  3 mL Intravenous Q12H  . tamsulosin  0.4 mg Oral Daily  . umeclidinium bromide  1 puff Inhalation Daily   Continuous Infusions: . sodium chloride       LOS: 15 days      Vernell Leep, MD, FACP, [facility] Pager [phone]  If 7PM-7AM, please contact night-coverage [email or web address] Password [facility] 06/05/2018, 6:24 PM

## 2018-06-05 NOTE — Progress Notes (Signed)
63min after giving pt 2 tubes of dextrose oral gel writer rechecked cbg was 88. Will continue to monitor patient

## 2018-06-06 LAB — CBC
HCT: 33.5 % — ABNORMAL LOW (ref 39.0–52.0)
Hemoglobin: 10.4 g/dL — ABNORMAL LOW (ref 13.0–17.0)
MCH: 31.4 pg (ref 26.0–34.0)
MCHC: 31 g/dL (ref 30.0–36.0)
MCV: 101.2 fL — ABNORMAL HIGH (ref 80.0–100.0)
Platelets: 211 K/uL (ref 150–400)
RBC: 3.31 MIL/uL — ABNORMAL LOW (ref 4.22–5.81)
RDW: 14.8 % (ref 11.5–15.5)
WBC: 12.8 K/uL — ABNORMAL HIGH (ref 4.0–10.5)
nRBC: 0 % (ref 0.0–0.2)

## 2018-06-06 LAB — BASIC METABOLIC PANEL WITH GFR
Anion gap: 8 (ref 5–15)
BUN: 96 mg/dL — ABNORMAL HIGH (ref 8–23)
CO2: 35 mmol/L — ABNORMAL HIGH (ref 22–32)
Calcium: 8.3 mg/dL — ABNORMAL LOW (ref 8.9–10.3)
Chloride: 97 mmol/L — ABNORMAL LOW (ref 98–111)
Creatinine, Ser: 2.5 mg/dL — ABNORMAL HIGH (ref 0.61–1.24)
GFR calc Af Amer: 26 mL/min — ABNORMAL LOW
GFR calc non Af Amer: 23 mL/min — ABNORMAL LOW
Glucose, Bld: 187 mg/dL — ABNORMAL HIGH (ref 70–99)
Potassium: 4.7 mmol/L (ref 3.5–5.1)
Sodium: 140 mmol/L (ref 135–145)

## 2018-06-06 LAB — GLUCOSE, CAPILLARY
Glucose-Capillary: 240 mg/dL — ABNORMAL HIGH (ref 70–99)
Glucose-Capillary: 298 mg/dL — ABNORMAL HIGH (ref 70–99)
Glucose-Capillary: 248 mg/dL — ABNORMAL HIGH (ref 70–99)
Glucose-Capillary: 238 mg/dL — ABNORMAL HIGH (ref 70–99)
Glucose-Capillary: 312 mg/dL — ABNORMAL HIGH (ref 70–99)

## 2018-06-06 MED ORDER — FUROSEMIDE 40 MG PO TABS
40.0000 mg | ORAL_TABLET | Freq: Every day | ORAL | Status: DC
  Administered 2018-06-07 – 2018-06-09 (×3): 40 mg via ORAL
  Filled 2018-06-06 (×2): qty 1

## 2018-06-06 MED ORDER — PREDNISONE 20 MG PO TABS
20.0000 mg | ORAL_TABLET | Freq: Every day | ORAL | Status: DC
  Administered 2018-06-06 – 2018-06-07 (×2): 20 mg via ORAL
  Filled 2018-06-06 (×2): qty 1

## 2018-06-06 NOTE — Progress Notes (Signed)
Progress Note    Justin Christensen  TDV:[id number] DOB: 01-27-1931  DOA: 05/21/2018 PCP: [name]    Brief Narrative:   Chief complaint: Follow-up hematuria  Medical records reviewed and are as summarized below:  Sriyan A Bristow is an 84 y.o. male with a PMH of diabetes, thrombocytopenia, hypertension, COPD, pulmonary hypertension, PAF on Eliquis, BPH, nephrolithiasis, anemia, and stage III CKD who was admitted 05/21/2018 with a chief complaint of hematuria in the setting of 2 recent hospitalizations, 1 4 evaluation of AKI, and the other related to hypoglycemia from sulfonylurea use in the setting of advanced CKD.  Subsequently admitted to [facility] for hypoglycemia and volume overload.  He was catheterized for close fluid monitoring, and overnight on 05/20/2018, removed the catheter traumatically resulting in significant hematuria.  Eliquis was held.  Catheter was reinserted but despite vigorous irrigation, continued to drain bloody urine and was associated with acute blood loss anemia and recurrent AKI.  He subsequently was admitted here for urological consultation.  Urology recommended continuing Foley catheter to drainage and irrigate as needed for clots/obstruction.  On 05/22/2018, he was noted to be confused.  On 05/23/2018, hemoglobin dropped to 6.6 mg/dL and the patient was ordered 2 units of PRBCs.  Posttransfusion hemoglobin was 8.5.  On 05/27/18, the patient's urine cleared sufficiently to allow removal of the catheter.  Eliquis was resumed on 05/28/2018.  On 05/30/2018, low-dose Lasix was resumed when creatinine improved to 2.26.  At this point, the patient remained stable for discharge, but there is a delay in obtaining [place] authorization for SNF placement.  Social worker anticipates we will be unable to place until 06/07/2018.  Ongoing intermittent confusion.  Developed hypoglycemia overnight 12/27.  Assessment/Plan:   Principal Problem:   Urethral trauma/hematuria  associated with acute blood loss anemia in the setting of chronic anticoagulation Urology consulted and patient was maintained on Foley catheter drainage with PRN irrigation for blood clots.  He did receive 2 units of PRBCs when his hemoglobin dropped to 6.6 on 05/23/2018.  By 05/27/2018, the patient's urine sufficiently cleared to allow removal of the Foley.  Eliquis subsequently resumed on 05/28/2018.  Hemoglobin now stable at 10.2 mg/dL.  Stable for discharge with regard to this specific issue.  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  We continue to await authorization for SNF placement. This patient has been assessed and is determined to have/lack capacity based on the following criteria: He does not understand his treatment/care options or how it applies to his condition, he does not understand the risk or benefits associated with treatment options, he does not understand the potential consequences associated with refusing treatment and his decision-making capacity appears to be impaired.  He continues to assert that he can manage his affairs at home when it is clear that he has significant physical debility. Ongoing confusion but no agitation.    Diabetes with history of hypoglycemic episodes Currently being managed with 30 units of Lantus daily with persistent scale SSI Q AC/HS. Due to persistent hyperglycemia, NovoLog mealtime 4 units had been added but patient developed severe hypoglycemia/45 g per DL on 12/28. NovoLog mealtime discontinued.   Reinstated Lantus this morning.  CBGs in the 200s.  Follow in a.m. and consider adjustment of insulins as needed.    CKD (chronic kidney disease), stage III-IV (HCC)/AKI Creatinine has gone up from 2.1-2.5.  Hold Lasix dose today.  Clinically euvolemic.  HTN (hypertension) Continue hydralazine, Norvasc and Coreg.  Coreg dose increased to 6.25 twice daily on 06/03/2018.   Reasonable control.    AF (paroxysmal atrial fibrillation) (HCC) Rate controlled on Coreg, anticoagulated with Eliquis.    COPD (chronic obstructive pulmonary disease) ([place]) with acute exacerbation Patient was placed on Solu-Medrol and has been getting Xopenex, Atrovent, and Ellipta.  Solu-Medrol discontinued and placed on prednisone 40 mg daily on 06/03/2018.  Continue to taper prednisone.  This may also be decreasing his insulin requirement.    Chronic anemia Hemoglobin stable with no evidence of ongoing blood loss from hematuria.    Thrombocytopenia (HCC) Resolved, platelet count WNL.    BPH (benign prostatic hyperplasia) Good urine output with Foley removal.    Pulmonary hypertension, moderate to severe (HCC) Chronic, stable.    Acute metabolic encephalopathy Likely related to AKI/possible uremia complicating underlying dementia.  Intermittent confusion ongoing.     Hyperlipidemia Lipitor substituted for Zocor due to concurrent treatment with Norvasc and risk for myalgia.  Will need to make this change at discharge.   Family Communication/Anticipated D/C date and plan/Code Status   DVT prophylaxis: Eliquis ordered. Code Status: Full Code.  Family Communication: None at bedside. Disposition Plan: Continues to await auth for SNF, stable for discharge.   Medical Consultants:    Urology   Anti-Infectives:    None   Subjective:   Pleasantly confused.  No agitation.  As per RN, no acute issues reported.  Objective:    Vitals:   06/06/18 0623 06/06/18 0631 06/06/18 1358 06/06/18 1414  BP:   (!) 164/63   Pulse:  77 (!) 52   Resp:   20   Temp:   97.9 F (36.6 C)   TempSrc:   Oral   SpO2: 91%  95% 94%  Weight:      Height:        Intake/Output Summary (Last 24 hours) at 06/06/2018 1755 Last data filed at 06/06/2018 0858 Gross per 24 hour  Intake 300 ml  Output 625 ml  Net -325 ml   Filed Weights   05/21/18 1906 05/25/18 0559  Weight: 77.9 kg 74.8  kg    Exam: General: Patient sitting up comfortably in chair without distress. Cardiovascular: Heart sounds show a regular rate, and rhythm. No gallops or rubs. No murmurs. No JVD.  Stable without change. Lungs: Slightly harsh breath sounds bilaterally but no wheezing or rhonchi.  No increased work of breathing Abdomen: Soft, nontender, nondistended with normal active bowel sounds. No masses. No hepatosplenomegaly.  Stable. Skin: Warm and dry. No rashes or lesions. Extremities: No clubbing or cyanosis. No edema. Pedal pulses 2+. CNS: Alert and oriented only to self.  No focal neurological deficits.  Data Reviewed:   I have personally reviewed following labs and imaging studies:  Labs: Labs show the following:   Basic Metabolic Panel: Recent Labs  Lab 06/01/18 0357 06/02/18 0353 06/04/18 0424 06/06/18 0538  NA 138 140  --  140  K 4.8 4.9  --  4.7  CL 98 99  --  97*  CO2 31 33*  --  35*  GLUCOSE 225* 221*  --  187*  BUN 89* 87*  --  96*  CREATININE 2.51* 2.44* 2.17* 2.50*  CALCIUM 8.3* 8.6*  --  8.3*   GFR Estimated Creatinine Clearance: 20.4 mL/min (A) (by C-G formula based on SCr of 2.5 mg/dL (H)).  CBC: Recent Labs  Lab 06/02/18 0353 06/06/18 0538  WBC 12.6* 12.8*  HGB  10.2* 10.4*  HCT 33.0* 33.5*  MCV 104.1* 101.2*  PLT 181 211   CBG: Recent Labs  Lab 06/05/18 1613 06/05/18 2124 06/06/18 0752 06/06/18 1134 06/06/18 1753  GLUCAP 121* 155* 240* 298* 248*    Microbiology No results found for this or any previous visit (from the past 240 hour(s)).  Procedures and diagnostic studies:  No results found.  Medications:   . amLODipine  5 mg Oral Daily  . apixaban  2.5 mg Oral BID  . atorvastatin  20 mg Oral QHS  . carvedilol  6.25 mg Oral BID WC  . [START ON 06/07/2018] furosemide  40 mg Oral Daily  . hydrALAZINE  50 mg Oral Q8H  . insulin aspart  0-20 Units Subcutaneous TID WC  . insulin aspart  0-5 Units Subcutaneous QHS  . insulin glargine  30  Units Subcutaneous Daily  . ipratropium  0.5 mg Nebulization TID  . levalbuterol  1.25 mg Nebulization TID  . polyethylene glycol  17 g Oral Daily  . predniSONE  20 mg Oral Q breakfast  . senna-docusate  1 tablet Oral BID  . sodium chloride flush  3 mL Intravenous Q12H  . tamsulosin  0.4 mg Oral Daily  . umeclidinium bromide  1 puff Inhalation Daily   Continuous Infusions: . sodium chloride       LOS: 16 days      Vernell Leep, MD, FACP, [facility]. [facility] Pager [phone]  If 7PM-7AM, please contact night-coverage [email or web address] Password [facility] 06/06/2018, 5:55 PM

## 2018-06-07 LAB — BASIC METABOLIC PANEL
Anion gap: 11 (ref 5–15)
BUN: 96 mg/dL — ABNORMAL HIGH (ref 8–23)
CHLORIDE: 96 mmol/L — AB (ref 98–111)
CO2: 31 mmol/L (ref 22–32)
CREATININE: 2.3 mg/dL — AB (ref 0.61–1.24)
Calcium: 8.6 mg/dL — ABNORMAL LOW (ref 8.9–10.3)
GFR calc Af Amer: 29 mL/min — ABNORMAL LOW (ref >60)
GFR calc non Af Amer: 25 mL/min — ABNORMAL LOW (ref >60)
Glucose, Bld: 219 mg/dL — ABNORMAL HIGH (ref 70–99)
Potassium: 4.5 mmol/L (ref 3.5–5.1)
Sodium: 138 mmol/L (ref 135–145)

## 2018-06-07 LAB — GLUCOSE, CAPILLARY
GLUCOSE-CAPILLARY: 502 mg/dL — AB (ref 70–99)
Glucose-Capillary: 229 mg/dL — ABNORMAL HIGH (ref 70–99)
Glucose-Capillary: 46 mg/dL — ABNORMAL LOW (ref 70–99)
Glucose-Capillary: 132 mg/dL — ABNORMAL HIGH (ref 70–99)
Glucose-Capillary: 84 mg/dL (ref 70–99)

## 2018-06-07 MED ORDER — INSULIN GLARGINE 100 UNIT/ML ~~LOC~~ SOLN
35.0000 [IU] | Freq: Every day | SUBCUTANEOUS | Status: DC
  Administered 2018-06-07: 35 [IU] via SUBCUTANEOUS
  Filled 2018-06-07 (×2): qty 0.35

## 2018-06-07 MED ORDER — PREDNISONE 10 MG PO TABS
10.0000 mg | ORAL_TABLET | Freq: Every day | ORAL | Status: DC
  Administered 2018-06-08 – 2018-06-10 (×3): 10 mg via ORAL
  Filled 2018-06-07 (×3): qty 1

## 2018-06-07 NOTE — Progress Notes (Signed)
Hypoglycemic Event  CBG: 46  Treatment: 8 oz juice/soda  Symptoms: None  Follow-up CBG: Time:1755 CBG Result:132  Possible Reasons for Event: Inadequate meal intake  Comments/MD notified:hypoglycemic protocol    Illa Level

## 2018-06-07 NOTE — Progress Notes (Signed)
Progress Note    Justin Christensen  TKZ:[id number] DOB: [birth year]  DOA: 05/21/2018 PCP: [name]    Brief Narrative:   Chief complaint: Follow-up hematuria  Medical records reviewed and are as summarized below:  Justin Christensen is an 84 y.o. male with a PMH of diabetes, thrombocytopenia, hypertension, COPD, pulmonary hypertension, PAF on Eliquis, BPH, nephrolithiasis, anemia, and stage III CKD who was admitted 05/21/2018 with a chief complaint of hematuria in the setting of 2 recent hospitalizations, 1 4 evaluation of AKI, and the other related to hypoglycemia from sulfonylurea use in the setting of advanced CKD.  Subsequently admitted to [facility] for hypoglycemia and volume overload.  He was catheterized for close fluid monitoring, and overnight on 05/20/2018, removed the catheter traumatically resulting in significant hematuria.  Eliquis was held.  Catheter was reinserted but despite vigorous irrigation, continued to drain bloody urine and was associated with acute blood loss anemia and recurrent AKI.  He subsequently was admitted here for urological consultation.  Urology recommended continuing Foley catheter to drainage and irrigate as needed for clots/obstruction.  On 05/22/2018, he was noted to be confused.  On 05/23/2018, hemoglobin dropped to 6.6 mg/dL and the patient was ordered 2 units of PRBCs.  Posttransfusion hemoglobin was 8.5.  On 05/27/18, the patient's urine cleared sufficiently to allow removal of the catheter.  Eliquis was resumed on 05/28/2018.  On 05/30/2018, low-dose Lasix was resumed when creatinine improved to 2.26.  At this point, the patient remained stable for discharge, but there is a delay in obtaining [place] authorization for SNF placement.  Social worker anticipates we will be unable to place until 06/07/2018.  Ongoing intermittent confusion.  DC plans canceled on 12/30 due to marked hyperglycemia/CBG 502.  Assessment/Plan:   Principal Problem:  Urethral trauma/hematuria associated with acute blood loss anemia in the setting of chronic anticoagulation Urology consulted and patient was maintained on Foley catheter drainage with PRN irrigation for blood clots.  He did receive 2 units of PRBCs when his hemoglobin dropped to 6.6 on 05/23/2018.  By 05/27/2018, the patient's urine sufficiently cleared to allow removal of the Foley.  Eliquis subsequently resumed on 05/28/2018.  Hemoglobin now stable at 10.2 mg/dL.  Stable for discharge with regard to this specific issue.  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  We continue to await authorization for SNF placement. This patient has been assessed and is determined to have/lack capacity based on the following criteria: He does not understand his treatment/care options or how it applies to his condition, he does not understand the risk or benefits associated with treatment options, he does not understand the potential consequences associated with refusing treatment and his decision-making capacity appears to be impaired.  He continues to assert that he can manage his affairs at home when it is clear that he has significant physical debility. Ongoing confusion but no agitation.    Diabetes with history of hypoglycemic episodes Currently being managed with 30 units of Lantus daily with persistent scale SSI Q AC/HS. Due to persistent hyperglycemia, NovoLog mealtime 4 units had been added but patient developed severe hypoglycemia/45 g per DL on 12/28. NovoLog mealtime discontinued.   Lantus 30 units daily resumed yesterday.  Fasting blood sugar was 229.  Lantus increased to 35 units daily.  Subsequent CBG 502.  Continue SSI without adding NovoLog meal scale.  However CBG then dropped to 46.  Likely related to  SSI he received.  Treat per protocol.  Monitor closely.  Prednisone has now been tapered to 10 mg daily and hopefully should  help with hyperglycemia.  Due to his fluctuating glycemic control, discharge plans canceled for today.  Social work made aware.    CKD (chronic kidney disease), stage III-IV (HCC)/AKI Creatinine has gone up from 2.1-2.5.  Hold Lasix dose today.  Clinically euvolemic.    HTN (hypertension) Continue hydralazine, Norvasc and Coreg.  Coreg dose increased to 6.25 twice daily on 06/03/2018.  Reasonable control.    AF (paroxysmal atrial fibrillation) (HCC) Rate controlled on Coreg, anticoagulated with Eliquis.    COPD (chronic obstructive pulmonary disease) ([place]) with acute exacerbation Patient was placed on Solu-Medrol and has been getting Xopenex, Atrovent, and Ellipta.  Solu-Medrol discontinued and placed on prednisone 40 mg daily on 06/03/2018.  Continue to taper prednisone.  This may also be decreasing his insulin requirement.    Chronic anemia Hemoglobin stable with no evidence of ongoing blood loss from hematuria.    Thrombocytopenia (HCC) Resolved, platelet count WNL.    BPH (benign prostatic hyperplasia) Good urine output with Foley removal.    Pulmonary hypertension, moderate to severe (HCC) Chronic, stable.    Acute metabolic encephalopathy Likely related to AKI/possible uremia complicating underlying dementia.  Intermittent confusion ongoing.     Hyperlipidemia Lipitor substituted for Zocor due to concurrent treatment with Norvasc and risk for myalgia.  Will need to make this change at discharge.   Family Communication/Anticipated D/C date and plan/Code Status   DVT prophylaxis: Eliquis ordered. Code Status: Full Code.  Family Communication: None at bedside. Disposition Plan: DC to SNF pending stability and glycemic control.   Medical Consultants:    Urology   Anti-Infectives:    None   Subjective:   Pleasantly confused but denies complaints.  No dyspnea or pain reported.  Objective:    Vitals:   06/07/18 0759 06/07/18 0801 06/07/18 1035 06/07/18 1406   BP:   128/72 130/65  Pulse:   76 87  Resp:    20  Temp:    98.9 F (37.2 C)  TempSrc:    Oral  SpO2: 96% 96%  93%  Weight:      Height:        Intake/Output Summary (Last 24 hours) at 06/07/2018 1656 Last data filed at 06/07/2018 1408 Gross per 24 hour  Intake 963 ml  Output 1150 ml  Net -187 ml   Filed Weights   05/21/18 1906 05/25/18 0559  Weight: 77.9 kg 74.8 kg    Exam:  General: Patient sitting up comfortably in chair without distress. Cardiovascular: Heart sounds show a regular rate, and rhythm. No gallops or rubs. No murmurs. No JVD.  Stable without change. Lungs: Slightly harsh breath sounds bilaterally but no wheezing or rhonchi.  Occasional coarse crackles bilaterally, possibly chronic related to ILD.  No increased work of breathing Abdomen: Soft, nontender, nondistended with normal active bowel sounds. No masses. No hepatosplenomegaly.  Stable. Skin: Warm and dry. No rashes or lesions. Extremities: No clubbing or cyanosis. No edema. Pedal pulses 2+. CNS: Alert and oriented only to self.  No focal neurological deficits.  Data Reviewed:   I have personally reviewed following labs and imaging studies:  Labs: Labs show the following:   Basic Metabolic Panel: Recent Labs  Lab 06/01/18 0357 06/02/18 0353 06/04/18 0424 06/06/18 0538 06/07/18 0429  NA 138 140  --  140 138  K 4.8 4.9  --  4.7 4.5  CL 98 99  --  97* 96*  CO2 31 33*  --  35* 31  GLUCOSE 225* 221*  --  187* 219*  BUN 89* 87*  --  96* 96*  CREATININE 2.51* 2.44* 2.17* 2.50* 2.30*  CALCIUM 8.3* 8.6*  --  8.3* 8.6*   GFR Estimated Creatinine Clearance: 22.2 mL/min (A) (by C-G formula based on SCr of 2.3 mg/dL (H)).  CBC: Recent Labs  Lab 06/02/18 0353 06/06/18 0538  WBC 12.6* 12.8*  HGB 10.2* 10.4*  HCT 33.0* 33.5*  MCV 104.1* 101.2*  PLT 181 211   CBG: Recent Labs  Lab 06/06/18 2030 06/06/18 2157 06/07/18 0738 06/07/18 1142 06/07/18 1651  GLUCAP 238* 312* 229* 502* 46*     Microbiology No results found for this or any previous visit (from the past 240 hour(s)).  Procedures and diagnostic studies:  No results found.  Medications:   . amLODipine  5 mg Oral Daily  . apixaban  2.5 mg Oral BID  . atorvastatin  20 mg Oral QHS  . carvedilol  6.25 mg Oral BID WC  . furosemide  40 mg Oral Daily  . hydrALAZINE  50 mg Oral Q8H  . insulin aspart  0-20 Units Subcutaneous TID WC  . insulin aspart  0-5 Units Subcutaneous QHS  . insulin glargine  35 Units Subcutaneous Daily  . ipratropium  0.5 mg Nebulization TID  . levalbuterol  1.25 mg Nebulization TID  . polyethylene glycol  17 g Oral Daily  . [START ON 06/08/2018] predniSONE  10 mg Oral Q breakfast  . senna-docusate  1 tablet Oral BID  . sodium chloride flush  3 mL Intravenous Q12H  . tamsulosin  0.4 mg Oral Daily  . umeclidinium bromide  1 puff Inhalation Daily   Continuous Infusions: . sodium chloride       LOS: 17 days      Vernell Leep, MD, FACP, [facility]. [facility] Pager [phone]  If 7PM-7AM, please contact night-coverage [email or web address] Password [facility] 06/07/2018, 4:56 PM

## 2018-06-07 NOTE — Progress Notes (Addendum)
Physical Therapy Treatment Patient Details Name: Justin Christensen MRN: [id number] DOB: 07-05-1930 Today's Date: 06/07/2018    History of Present Illness 84 y.o. male admitted with hypoglycemia, confusion, and hematuria. PMH significant for DM, mild thrombocytopenia, HTN, COPD, pulmonary HTN, atrial fibrillation on eliquis, nephrolithiasis, CKD and anemia.    PT Comments    Pt with 10 ft ambulation in room today with use of RW. PT trialled room air ambulation with pt due to his insistence and refusal of O2 initially, but pt with desats to high 80s and noticeable dyspnea. Pt placed back on 2LO2 due to this. Pt with lack of safety awareness, poor endurance for mobility, and deconditioning. PT continuing to recommend SNF. Pt plans to d/c today.    Follow Up Recommendations  SNF     Equipment Recommendations  Rolling walker with 5" wheels    Recommendations for Other Services       Precautions / Restrictions Precautions Precautions: Fall Precaution Comments: currently on oxygen, pt reports he does not wear O2 at baseline. Pt is irritated PT brought O2 tank into room, stating "I don't need that"  Restrictions Weight Bearing Restrictions: No    Mobility  Bed Mobility Overal bed mobility: Needs Assistance Bed Mobility: Sit to Supine       Sit to supine: Mod assist   General bed mobility comments: Pt up in chair upon arrival to room. Mod assist for sit to supine for LE lifting and scooting pt up in bed.  Transfers Overall transfer level: Needs assistance Equipment used: Rolling walker (2 wheeled) Transfers: Sit to/from Stand Sit to Stand: Min guard         General transfer comment: Min guard for safety. Pt did not follow verbal cues to push up from chair, grabbed onto RW with both UEs.   Ambulation/Gait Ambulation/Gait assistance: Min assist Gait Distance (Feet): 10 Feet Assistive device: Rolling walker (2 wheeled) Gait Pattern/deviations: Step-to pattern;Trunk  flexed;Shuffle Gait velocity: decr   General Gait Details: Pt with very slow gait, min assist for steadying and directing pt. Pt with urinary incotinence after 5 ft ambulation. Due to pt refusing O2, pt on RA for first 5 ft but desatted to 88%. Pt placed on 2LO2 for rest of ambulation, sats in the low 90s. pt with RW pushed out in front of him for ambulation, verbal cuing to correct this not successful.    Stairs             Wheelchair Mobility    Modified Rankin (Stroke Patients Only)       Balance Overall balance assessment: Needs assistance Sitting-balance support: No upper extremity supported;Feet supported Sitting balance-Leahy Scale: Fair Sitting balance - Comments: sitting up in recliner without back or UE support upon arrival   Standing balance support: Bilateral upper extremity supported Standing balance-Leahy Scale: Poor Standing balance comment: pt with reliance on RW                            Cognition Arousal/Alertness: Awake/alert Behavior During Therapy: WFL for tasks assessed/performed Overall Cognitive Status: Impaired/Different from baseline Area of Impairment: Memory;Following commands;Problem solving                     Memory: Decreased short-term memory;Decreased recall of precautions Following Commands: Follows one step commands inconsistently     Problem Solving: Difficulty sequencing;Requires verbal cues;Requires tactile cues General Comments: Upon arrival to room, pt with pulled condom cath  with urine all over floor. PT explained to pt x5 why he needed supplemental O2, but continued to state "I don't need oxygen" throughout session. Pt asked me to call someone for him, and when asked who to call, he states "I don't know".       Exercises      General Comments General comments (skin integrity, edema, etc.): Due to pt refusing O2, pt on RA for first 5 ft but desatted to 88%. Pt placed on 2LO2 for rest of ambulation, sats  in the low 90s.       Pertinent Vitals/Pain Pain Assessment: No/denies pain    Home Living                      Prior Function            PT Goals (current goals can now be found in the care plan section) Progress towards PT goals: Progressing toward goals    Frequency    Min 2X/week      PT Plan Current plan remains appropriate    Co-evaluation              AM-PAC PT "6 Clicks" Mobility   Outcome Measure  Help needed turning from your back to your side while in a flat bed without using bedrails?: A Little Help needed moving from lying on your back to sitting on the side of a flat bed without using bedrails?: A Little Help needed moving to and from a bed to a chair (including a wheelchair)?: A Little Help needed standing up from a chair using your arms (e.g., wheelchair or bedside chair)?: A Little Help needed to walk in hospital room?: A Lot Help needed climbing 3-5 steps with a railing? : A Lot 6 Click Score: 16    End of Session Equipment Utilized During Treatment: Gait belt;Oxygen Activity Tolerance: Patient limited by fatigue Patient left: with call bell/phone within reach;in bed;with bed alarm set;with nursing/sitter in room Nurse Communication: Mobility status(spoke with NT about pulled condom cath, mobility in room) PT Visit Diagnosis: Muscle weakness (generalized) (M62.81);Difficulty in walking, not elsewhere classified (R26.2)     Time: 0086-7619 PT Time Calculation (min) (ACUTE ONLY): 19 min  Charges:  $Gait Training: 8-22 mins                     Julien Girt, PT Acute Rehabilitation Services Pager [phone]  Office [phone]    Atlee Villers D Axelle Szwed 06/07/2018, 3:20 PM

## 2018-06-07 NOTE — Progress Notes (Signed)
Pt's [facility] contract approved for SNF admission at [facility] when medically stable.  Sharren Bridge, MSW, LCSW Clinical Social Work 06/07/2018 [phone]

## 2018-06-08 LAB — CBC
HCT: 30.6 % — ABNORMAL LOW (ref 39.0–52.0)
Hemoglobin: 9.4 g/dL — ABNORMAL LOW (ref 13.0–17.0)
MCH: 32.1 pg (ref 26.0–34.0)
MCHC: 30.7 g/dL (ref 30.0–36.0)
MCV: 104.4 fL — AB (ref 80.0–100.0)
Platelets: 180 10*3/uL (ref 150–400)
RBC: 2.93 MIL/uL — ABNORMAL LOW (ref 4.22–5.81)
RDW: 15.2 % (ref 11.5–15.5)
WBC: 17 10*3/uL — ABNORMAL HIGH (ref 4.0–10.5)
nRBC: 0 % (ref 0.0–0.2)

## 2018-06-08 LAB — GLUCOSE, CAPILLARY
GLUCOSE-CAPILLARY: 93 mg/dL (ref 70–99)
GLUCOSE-CAPILLARY: 100 mg/dL — AB (ref 70–99)
Glucose-Capillary: 34 mg/dL — CL (ref 70–99)
Glucose-Capillary: 144 mg/dL — ABNORMAL HIGH (ref 70–99)
Glucose-Capillary: 118 mg/dL — ABNORMAL HIGH (ref 70–99)
Glucose-Capillary: 147 mg/dL — ABNORMAL HIGH (ref 70–99)
Glucose-Capillary: 168 mg/dL — ABNORMAL HIGH (ref 70–99)
Glucose-Capillary: 98 mg/dL (ref 70–99)
Glucose-Capillary: 241 mg/dL — ABNORMAL HIGH (ref 70–99)
Glucose-Capillary: 222 mg/dL — ABNORMAL HIGH (ref 70–99)

## 2018-06-08 LAB — BASIC METABOLIC PANEL
Anion gap: 8 (ref 5–15)
BUN: 92 mg/dL — ABNORMAL HIGH (ref 8–23)
CO2: 32 mmol/L (ref 22–32)
CREATININE: 2.16 mg/dL — AB (ref 0.61–1.24)
Calcium: 8.2 mg/dL — ABNORMAL LOW (ref 8.9–10.3)
Chloride: 98 mmol/L (ref 98–111)
GFR calc Af Amer: 31 mL/min — ABNORMAL LOW (ref >60)
GFR calc non Af Amer: 27 mL/min — ABNORMAL LOW (ref >60)
Glucose, Bld: 137 mg/dL — ABNORMAL HIGH (ref 70–99)
Potassium: 4.3 mmol/L (ref 3.5–5.1)
Sodium: 138 mmol/L (ref 135–145)

## 2018-06-08 LAB — LACTIC ACID, PLASMA: Lactic Acid, Venous: 0.7 mmol/L (ref 0.5–1.9)

## 2018-06-08 MED ORDER — DEXTROSE 50 % IV SOLN
INTRAVENOUS | Status: AC
  Administered 2018-06-08: 50 mL via INTRAVENOUS
  Filled 2018-06-08: qty 50

## 2018-06-08 MED ORDER — INSULIN ASPART 100 UNIT/ML ~~LOC~~ SOLN
0.0000 [IU] | Freq: Every day | SUBCUTANEOUS | Status: DC
  Administered 2018-06-08: 2 [IU] via SUBCUTANEOUS

## 2018-06-08 MED ORDER — DEXTROSE 50 % IV SOLN
25.0000 g | Freq: Once | INTRAVENOUS | Status: AC
  Administered 2018-06-08: 50 mL via INTRAVENOUS

## 2018-06-08 MED ORDER — INSULIN ASPART 100 UNIT/ML ~~LOC~~ SOLN
0.0000 [IU] | Freq: Three times a day (TID) | SUBCUTANEOUS | Status: DC
  Administered 2018-06-08 – 2018-06-09 (×2): 3 [IU] via SUBCUTANEOUS
  Administered 2018-06-09: 2 [IU] via SUBCUTANEOUS
  Administered 2018-06-10: 3 [IU] via SUBCUTANEOUS
  Administered 2018-06-10 (×2): 7 [IU] via SUBCUTANEOUS

## 2018-06-08 MED ORDER — DEXTROSE 50 % IV SOLN: 25.0000 g | INTRAVENOUS | Status: DC | PRN

## 2018-06-08 MED ORDER — INSULIN GLARGINE 100 UNIT/ML ~~LOC~~ SOLN
10.0000 [IU] | Freq: Every day | SUBCUTANEOUS | Status: DC
  Administered 2018-06-08: 10 [IU] via SUBCUTANEOUS
  Filled 2018-06-08: qty 0.1

## 2018-06-08 NOTE — Progress Notes (Signed)
CBG re checked 15 mins later 144, Pt alert and oriented, states feeling much better, will continue to monitor, oncall provider paged.

## 2018-06-08 NOTE — Progress Notes (Addendum)
Hypoglycemic Event  CBG: 34 @0328   Treatment: 25 mg dextrose amp given  Symptoms: lethargic, nauseous   Follow-up CBG: 7944  CBG Result:144  Possible Reasons for Event: inadequate diet?  Comments/MD notified: oncallprovider made aware     Fransico Him, RN      Pt lethargic, nauseas states not feeling well, cbg checked was 34, 25g of dextrose given, along with peanut butter and crackers and 8 oz of orange juice. Pt more alert and states feeling better, will recheck in 15 mins

## 2018-06-08 NOTE — Progress Notes (Addendum)
Progress Note    Justin Christensen  NGE:[id number] DOB: [birth year]  DOA: 05/21/2018 PCP: [name]    Brief Narrative:   Chief complaint: Follow-up hematuria  Medical records reviewed and are as summarized below:  Justin Christensen is an 84 y.o. male with a PMH of diabetes, thrombocytopenia, hypertension, COPD, pulmonary hypertension, PAF on Eliquis, BPH, nephrolithiasis, anemia, and stage III CKD who was admitted 05/21/2018 with a chief complaint of hematuria in the setting of 2 recent hospitalizations, 1 4 evaluation of AKI, and the other related to hypoglycemia from sulfonylurea use in the setting of advanced CKD.  Subsequently admitted to [facility] for hypoglycemia and volume overload.  He was catheterized for close fluid monitoring, and overnight on 05/20/2018, removed the catheter traumatically resulting in significant hematuria.  Eliquis was held.  Catheter was reinserted but despite vigorous irrigation, continued to drain bloody urine and was associated with acute blood loss anemia and recurrent AKI.  He subsequently was admitted here for urological consultation.  Urology recommended continuing Foley catheter to drainage and irrigate as needed for clots/obstruction.  On 05/22/2018, he was noted to be confused.  On 05/23/2018, hemoglobin dropped to 6.6 mg/dL and the patient was ordered 2 units of PRBCs.  Posttransfusion hemoglobin was 8.5.  On 05/27/18, the patient's urine cleared sufficiently to allow removal of the catheter.  Eliquis was resumed on 05/28/2018.  On 05/30/2018, low-dose Lasix was resumed when creatinine improved to 2.26.  At this point, the patient remained stable for discharge, but there is a delay in obtaining [place] authorization for SNF placement.  Social worker anticipates we will be unable to place until 06/07/2018.  Ongoing intermittent confusion.  DC plans on hold due to fluctuating glycemic control/hypoglycemia.  Assessment/Plan:   Principal Problem:  Urethral trauma/hematuria associated with acute blood loss anemia in the setting of chronic anticoagulation Urology consulted and patient was maintained on Foley catheter drainage with PRN irrigation for blood clots.  He did receive 2 units of PRBCs when his hemoglobin dropped to 6.6 on 05/23/2018.  By 05/27/2018, the patient's urine sufficiently cleared to allow removal of the Foley.  Eliquis subsequently resumed on 05/28/2018.  Hemoglobin now stable at 10.2 mg/dL.  Stable for discharge with regard to this specific issue.  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  We continue to await authorization for SNF placement. This patient has been assessed and is determined to have/lack capacity based on the following criteria: He does not understand his treatment/care options or how it applies to his condition, he does not understand the risk or benefits associated with treatment options, he does not understand the potential consequences associated with refusing treatment and his decision-making capacity appears to be impaired.  He continues to assert that he can manage his affairs at home when it is clear that he has significant physical debility. Ongoing confusion but no agitation.    Diabetes with history of hypoglycemic episodes Patient on sliding scale insulin prior to admission.  In the hospital placed on pretty high doses of Lantus, SSI and eventually mealtime NovoLog.  Hyperglycemia likely precipitated by steroids.  A1c on 03/17/2018: 7.2. Since last weekend, patient started experiencing hypoglycemic events, continued despite cutting back on insulins.  Overnight had CBG of 34.  Hypothermia was likely related to hypoglycemia.  Discontinued Lantus, changed SSI to sensitive.  CBG this afternoon is up to 241.  Will initiate low-dose Lantus 10 units  at bedtime tonight and monitor closely.  Tapering steroids likely causing decreased  insulin needs.     CKD (chronic kidney disease), stage III-IV (HCC)/AKI Creatinine improved to 2.16.    HTN (hypertension) Continue hydralazine, Norvasc and Coreg.  Coreg dose increased to 6.25 twice daily on 06/03/2018.  Reasonable control.    AF (paroxysmal atrial fibrillation) (HCC) Rate controlled on Coreg, anticoagulated with Eliquis.    COPD (chronic obstructive pulmonary disease) ([place]) with acute exacerbation Patient was placed on Solu-Medrol and has been getting Xopenex, Atrovent, and Ellipta.  Solu-Medrol discontinued and placed on prednisone 40 mg daily on 06/03/2018.  Continue to taper prednisone.  This may also be decreasing his insulin requirement.    Chronic anemia Hemoglobin stable with no evidence of ongoing blood loss from hematuria.    Thrombocytopenia (HCC) Resolved, platelet count WNL.    BPH (benign prostatic hyperplasia) Good urine output with Foley removal.    Pulmonary hypertension, moderate to severe (HCC) Chronic, stable.    Acute metabolic encephalopathy Likely related to AKI/possible uremia complicating underlying dementia.  Intermittent confusion ongoing.     Hyperlipidemia Lipitor substituted for Zocor due to concurrent treatment with Norvasc and risk for myalgia.  Will need to make this change at discharge.   Family Communication/Anticipated D/C date and plan/Code Status   DVT prophylaxis: Eliquis ordered. Code Status: Full Code.  Family Communication: None at bedside. Disposition Plan: DC to SNF pending stability and glycemic control.   Medical Consultants:    Urology   Anti-Infectives:    None   Subjective:   Patient keeps repeating that he wants to get out of here.  Only oriented to self.  Remains confused but not agitated.  Overnight hypoglycemia noted.  Objective:    Vitals:   06/08/18 0752 06/08/18 0809 06/08/18 1339 06/08/18 1415  BP:  118/64 (!) 106/53   Pulse:  80 75   Resp:  16 18   Temp:  99 F (37.2 C) 98.3  F (36.8 C)   TempSrc:  Oral Oral   SpO2: 92% 94% 91% 92%  Weight:      Height:        Intake/Output Summary (Last 24 hours) at 06/08/2018 1751 Last data filed at 06/08/2018 0800 Gross per 24 hour  Intake 840 ml  Output 100 ml  Net 740 ml   Filed Weights   05/21/18 1906 05/25/18 0559  Weight: 77.9 kg 74.8 kg    Exam: No significant change in clinical exam compared to yesterday. General: Patient sitting up comfortably in chair without distress. Cardiovascular: Heart sounds show a regular rate, and rhythm. No gallops or rubs. No murmurs. No JVD.  Stable without change. Lungs: Slightly harsh breath sounds bilaterally but no wheezing or rhonchi.  Occasional coarse crackles bilaterally, possibly chronic related to ILD.  No increased work of breathing Abdomen: Soft, nontender, nondistended with normal active bowel sounds. No masses. No hepatosplenomegaly.  Stable. Skin: Warm and dry. No rashes or lesions. Extremities: No clubbing or cyanosis. No edema. Pedal pulses 2+. CNS: Alert and oriented only to self.  No focal neurological deficits.  Data Reviewed:   I have personally reviewed following labs and imaging studies:  Labs: Labs show the following:   Basic Metabolic Panel: Recent Labs  Lab 06/02/18 0353 06/04/18 0424 06/06/18 0538 06/07/18 0429 06/08/18 0544  NA 140  --  140 138 138  K 4.9  --  4.7 4.5 4.3  CL 99  --  97* 96* 98  CO2 33*  --  35* 31 32  GLUCOSE 221*  --  187* 219* 137*  BUN 87*  --  96* 96* 92*  CREATININE 2.44* 2.17* 2.50* 2.30* 2.16*  CALCIUM 8.6*  --  8.3* 8.6* 8.2*   GFR Estimated Creatinine Clearance: 23.6 mL/min (A) (by C-G formula based on SCr of 2.16 mg/dL (H)).  CBC: Recent Labs  Lab 06/02/18 0353 06/06/18 0538 06/08/18 0544  WBC 12.6* 12.8* 17.0*  HGB 10.2* 10.4* 9.4*  HCT 33.0* 33.5* 30.6*  MCV 104.1* 101.2* 104.4*  PLT 181 211 180   CBG: Recent Labs  Lab 06/08/18 0527 06/08/18 0627 06/08/18 0727 06/08/18 1114  06/08/18 1606  GLUCAP 168* 93 100* 98 241*    Microbiology No results found for this or any previous visit (from the past 240 hour(s)).  Procedures and diagnostic studies:  No results found.  Medications:   . amLODipine  5 mg Oral Daily  . apixaban  2.5 mg Oral BID  . atorvastatin  20 mg Oral QHS  . carvedilol  6.25 mg Oral BID WC  . furosemide  40 mg Oral Daily  . hydrALAZINE  50 mg Oral Q8H  . insulin aspart  0-5 Units Subcutaneous QHS  . insulin aspart  0-9 Units Subcutaneous TID WC  . ipratropium  0.5 mg Nebulization TID  . levalbuterol  1.25 mg Nebulization TID  . polyethylene glycol  17 g Oral Daily  . predniSONE  10 mg Oral Q breakfast  . senna-docusate  1 tablet Oral BID  . sodium chloride flush  3 mL Intravenous Q12H  . tamsulosin  0.4 mg Oral Daily  . umeclidinium bromide  1 puff Inhalation Daily   Continuous Infusions: . sodium chloride       LOS: 18 days      Vernell Leep, MD, FACP, [facility]. [facility] Pager [phone]  If 7PM-7AM, please contact night-coverage [email or web address] Password [facility] 06/08/2018, 5:51 PM

## 2018-06-08 NOTE — Progress Notes (Signed)
VSS this AM and blood sugar 100.  Patient awake and eating breakfast.  Patient complains of being hot, extra blankets removed - temp 99 orally.

## 2018-06-09 DIAGNOSIS — Z794 Long term (current) use of insulin: Secondary | ICD-10-CM

## 2018-06-09 DIAGNOSIS — E11649 Type 2 diabetes mellitus with hypoglycemia without coma: Secondary | ICD-10-CM

## 2018-06-09 LAB — GLUCOSE, CAPILLARY
GLUCOSE-CAPILLARY: 83 mg/dL (ref 70–99)
Glucose-Capillary: 56 mg/dL — ABNORMAL LOW (ref 70–99)
Glucose-Capillary: 224 mg/dL — ABNORMAL HIGH (ref 70–99)
Glucose-Capillary: 165 mg/dL — ABNORMAL HIGH (ref 70–99)
Glucose-Capillary: 191 mg/dL — ABNORMAL HIGH (ref 70–99)

## 2018-06-09 LAB — CBC
HCT: 28.4 % — ABNORMAL LOW (ref 39.0–52.0)
Hemoglobin: 8.4 g/dL — ABNORMAL LOW (ref 13.0–17.0)
MCH: 31.1 pg (ref 26.0–34.0)
MCHC: 29.6 g/dL — ABNORMAL LOW (ref 30.0–36.0)
MCV: 105.2 fL — ABNORMAL HIGH (ref 80.0–100.0)
NRBC: 0 % (ref 0.0–0.2)
Platelets: 184 10*3/uL (ref 150–400)
RBC: 2.7 MIL/uL — ABNORMAL LOW (ref 4.22–5.81)
RDW: 15.3 % (ref 11.5–15.5)
WBC: 18.1 10*3/uL — ABNORMAL HIGH (ref 4.0–10.5)

## 2018-06-09 NOTE — Progress Notes (Signed)
Progress Note    [name]  WPY:[id number] DOB: [birth year]  DOA: 05/21/2018 PCP: [name]    Brief Narrative:   Chief complaint: Follow-up hematuria  Medical records reviewed and are as summarized below:  Justin Christensen is an 85 y.o. male with a PMH of diabetes, thrombocytopenia, hypertension, COPD, pulmonary hypertension, PAF on Eliquis, BPH, nephrolithiasis, anemia, and stage III CKD who was admitted 05/21/2018 with a chief complaint of hematuria in the setting of 2 recent hospitalizations, 1 4 evaluation of AKI, and the other related to hypoglycemia from sulfonylurea use in the setting of advanced CKD.  Subsequently admitted to [facility] for hypoglycemia and volume overload.  He was catheterized for close fluid monitoring, and overnight on 05/20/2018, removed the catheter traumatically resulting in significant hematuria.  Eliquis was held.  Catheter was reinserted but despite vigorous irrigation, continued to drain bloody urine and was associated with acute blood loss anemia and recurrent AKI.  He subsequently was admitted here for urological consultation.  Urology recommended continuing Foley catheter to drainage and irrigate as needed for clots/obstruction.  On 05/22/2018, he was noted to be confused.  On 05/23/2018, hemoglobin dropped to 6.6 mg/dL and the patient was ordered 2 units of PRBCs.  Posttransfusion hemoglobin was 8.5.  On 05/27/18, the patient's urine cleared sufficiently to allow removal of the catheter.  Eliquis was resumed on 05/28/2018.  On 05/30/2018, low-dose Lasix was resumed when creatinine improved to 2.26.  Patient has SNF bed and insurance approval.  Ongoing intermittent confusion.  DC plans on hold due to fluctuating glycemic control/hypoglycemia, hopefully can stabilize by tomorrow for DC to SNF.  Assessment/Plan:   Principal Problem:   Urethral trauma/hematuria associated with acute blood loss anemia in the setting of chronic  anticoagulation Urology consulted and patient was maintained on Foley catheter drainage with PRN irrigation for blood clots.  He did receive 2 units of PRBCs when his hemoglobin dropped to 6.6 on 05/23/2018.  By 05/27/2018, the patient's urine sufficiently cleared to allow removal of the Foley.  Eliquis subsequently resumed on 05/28/2018. Stable for discharge with regard to this specific issue.  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  We continue to await authorization for SNF placement. This patient has been assessed and is determined to have/lack capacity based on the following criteria: He does not understand his treatment/care options or how it applies to his condition, he does not understand the risk or benefits associated with treatment options, he does not understand the potential consequences associated with refusing treatment and his decision-making capacity appears to be impaired.  He continues to assert that he can manage his affairs at home when it is clear that he has significant physical debility. Ongoing confusion but no agitation.    Diabetes with history of hypoglycemic episodes Patient on sliding scale insulin prior to admission.  In the hospital placed on pretty high doses of Lantus, SSI and eventually mealtime NovoLog.  Hyperglycemia likely precipitated by steroids.  A1c on 03/17/2018: 7.2. Since last weekend, patient started experiencing hypoglycemic events, continued despite cutting back on insulins.  Overnight had CBG of 34.  Hypothermia was likely related to hypoglycemia.    Lantus was held and then resumed at lower dose of 10 units last night and sliding scale insulin changed to sensitive.  Despite this, patient had hypoglycemic episode of 56 this morning.  Discontinued Lantus.  Continue SSI alone and monitor.  CKD (chronic kidney disease), stage III-IV (HCC)/AKI Creatinine improved to 2.16.     HTN (hypertension) Continue hydralazine, Norvasc and Coreg.  Coreg dose increased to 6.25 twice daily on 06/03/2018.  Reasonable control.    AF (paroxysmal atrial fibrillation) (HCC) Rate controlled on Coreg, anticoagulated with Eliquis.    COPD (chronic obstructive pulmonary disease) ([place]) with acute exacerbation Patient was placed on Solu-Medrol and has been getting Xopenex, Atrovent, and Ellipta.  Solu-Medrol discontinued and placed on prednisone 40 mg daily on 06/03/2018.  Continue to taper prednisone.  This may also be decreasing his insulin requirement.    Chronic anemia Hemoglobin has dropped from 10.4 > 9.4 > 8.4 in the absence of overt bleeding.  Continue to follow CBC daily.  Also leukocytosis of unclear etiology.    Thrombocytopenia (HCC) Resolved, platelet count WNL.    BPH (benign prostatic hyperplasia) Good urine output with Foley removal.    Pulmonary hypertension, moderate to severe (HCC) Chronic, stable.    Acute metabolic encephalopathy Likely related to AKI/possible uremia complicating underlying dementia.  Intermittent confusion ongoing.     Hyperlipidemia Lipitor substituted for Zocor due to concurrent treatment with Norvasc and risk for myalgia.  Will need to make this change at discharge.   Family Communication/Anticipated D/C date and plan/Code Status   DVT prophylaxis: Eliquis ordered. Code Status: Full Code.  Family Communication: Unable to reach daughter via phone, left voicemail message. Disposition Plan: DC to SNF pending stability and glycemic control.   Medical Consultants:    Urology   Anti-Infectives:    None   Subjective:   Overnight events noted.  Hypoglycemic early this morning.  Denies complaints and still insisting on being discharged soon.  No dyspnea reported.  Objective:    Vitals:   06/09/18 0811 06/09/18 0834 06/09/18 1226 06/09/18 1342  BP:   103/60   Pulse:   (!) 46   Resp:   18   Temp:   97.9 F (36.6 C)     TempSrc:   Oral   SpO2: 93% 95% 91% 90%  Weight:      Height:        Intake/Output Summary (Last 24 hours) at 06/09/2018 1537 Last data filed at 06/09/2018 1200 Gross per 24 hour  Intake 800 ml  Output 550 ml  Net 250 ml   Filed Weights   05/21/18 1906 05/25/18 0559  Weight: 77.9 kg 74.8 kg    Exam:  General: Pleasant elderly male, moderately built and frail, chronically ill looking, lying comfortably supine in bed without distress. Cardiovascular: S1 and S2 heard, RRR.  No JVD, murmurs or pedal edema. Lungs: Slightly harsh breath sounds bilaterally with some basal coarse Velcro-like crackles but no wheezing or rhonchi.  No increased work of breathing.  Able to speak in full sentences without distress. Abdomen: Soft, nontender, nondistended with normal active bowel sounds. No masses. No hepatosplenomegaly.  Stable. Skin: Warm and dry. No rashes or lesions. Extremities: No clubbing or cyanosis. No edema. Pedal pulses 2+. CNS: Alert and oriented only to self.  No focal neurological deficits.  Data Reviewed:   I have personally reviewed following labs and imaging studies:  Labs: Labs show the following:   Basic Metabolic Panel: Recent Labs  Lab 06/04/18 0424  06/06/18 0538 06/07/18 0429 06/08/18 0544  NA  --   --  140 138 138  K  --    < > 4.7 4.5 4.3  CL  --   --  97* 96* 98  CO2  --   --  35* 31 32  GLUCOSE  --   --  187* 219* 137*  BUN  --   --  96* 96* 92*  CREATININE 2.17*  --  2.50* 2.30* 2.16*  CALCIUM  --   --  8.3* 8.6* 8.2*   < > = values in this interval not displayed.   GFR Estimated Creatinine Clearance: 23.6 mL/min (A) (by C-G formula based on SCr of 2.16 mg/dL (H)).  CBC: Recent Labs  Lab 06/06/18 0538 06/08/18 0544 06/09/18 0457  WBC 12.8* 17.0* 18.1*  HGB 10.4* 9.4* 8.4*  HCT 33.5* 30.6* 28.4*  MCV 101.2* 104.4* 105.2*  PLT 211 180 184   CBG: Recent Labs  Lab 06/08/18 1114 06/08/18 1606 06/08/18 2242 06/09/18 0718 06/09/18 1138   GLUCAP 98 241* 222* 56* 224*    Microbiology No results found for this or any previous visit (from the past 240 hour(s)).  Procedures and diagnostic studies:  No results found.  Medications:   . amLODipine  5 mg Oral Daily  . apixaban  2.5 mg Oral BID  . atorvastatin  20 mg Oral QHS  . carvedilol  6.25 mg Oral BID WC  . furosemide  40 mg Oral Daily  . hydrALAZINE  50 mg Oral Q8H  . insulin aspart  0-5 Units Subcutaneous QHS  . insulin aspart  0-9 Units Subcutaneous TID WC  . ipratropium  0.5 mg Nebulization TID  . levalbuterol  1.25 mg Nebulization TID  . polyethylene glycol  17 g Oral Daily  . predniSONE  10 mg Oral Q breakfast  . senna-docusate  1 tablet Oral BID  . sodium chloride flush  3 mL Intravenous Q12H  . tamsulosin  0.4 mg Oral Daily  . umeclidinium bromide  1 puff Inhalation Daily   Continuous Infusions: . sodium chloride       LOS: 19 days      Vernell Leep, MD, FACP, [facility]. [facility] Pager [phone]  If 7PM-7AM, please contact night-coverage [email or web address] Password [facility] 06/09/2018, 3:37 PM

## 2018-06-09 NOTE — Progress Notes (Signed)
Patients am blood sugar = 56; after eating breakfast increased to 80s.  Patient a/ox2.  Dr. Algis Liming informed.

## 2018-06-10 DIAGNOSIS — J438 Other emphysema: Secondary | ICD-10-CM

## 2018-06-10 DIAGNOSIS — N179 Acute kidney failure, unspecified: Secondary | ICD-10-CM

## 2018-06-10 DIAGNOSIS — S3730XS Unspecified injury of urethra, sequela: Secondary | ICD-10-CM

## 2018-06-10 DIAGNOSIS — D696 Thrombocytopenia, unspecified: Secondary | ICD-10-CM

## 2018-06-10 DIAGNOSIS — D62 Acute posthemorrhagic anemia: Secondary | ICD-10-CM

## 2018-06-10 DIAGNOSIS — F0391 Unspecified dementia with behavioral disturbance: Secondary | ICD-10-CM

## 2018-06-10 DIAGNOSIS — I480 Paroxysmal atrial fibrillation: Secondary | ICD-10-CM

## 2018-06-10 DIAGNOSIS — N400 Enlarged prostate without lower urinary tract symptoms: Secondary | ICD-10-CM

## 2018-06-10 DIAGNOSIS — E162 Hypoglycemia, unspecified: Secondary | ICD-10-CM

## 2018-06-10 DIAGNOSIS — G9341 Metabolic encephalopathy: Secondary | ICD-10-CM

## 2018-06-10 LAB — BASIC METABOLIC PANEL
Anion gap: 12 (ref 5–15)
BUN: 98 mg/dL — ABNORMAL HIGH (ref 8–23)
CO2: 28 mmol/L (ref 22–32)
CREATININE: 2.7 mg/dL — AB (ref 0.61–1.24)
Calcium: 8.1 mg/dL — ABNORMAL LOW (ref 8.9–10.3)
Chloride: 98 mmol/L (ref 98–111)
GFR calc Af Amer: 24 mL/min — ABNORMAL LOW (ref >60)
GFR calc non Af Amer: 21 mL/min — ABNORMAL LOW (ref >60)
Glucose, Bld: 292 mg/dL — ABNORMAL HIGH (ref 70–99)
Potassium: 4.8 mmol/L (ref 3.5–5.1)
Sodium: 138 mmol/L (ref 135–145)

## 2018-06-10 LAB — CBC
HCT: 27.7 % — ABNORMAL LOW (ref 39.0–52.0)
Hemoglobin: 8.3 g/dL — ABNORMAL LOW (ref 13.0–17.0)
MCH: 31.8 pg (ref 26.0–34.0)
MCHC: 30 g/dL (ref 30.0–36.0)
MCV: 106.1 fL — ABNORMAL HIGH (ref 80.0–100.0)
PLATELETS: 171 10*3/uL (ref 150–400)
RBC: 2.61 MIL/uL — ABNORMAL LOW (ref 4.22–5.81)
RDW: 15.1 % (ref 11.5–15.5)
WBC: 14.5 10*3/uL — ABNORMAL HIGH (ref 4.0–10.5)
nRBC: 0 % (ref 0.0–0.2)

## 2018-06-10 LAB — GLUCOSE, CAPILLARY
Glucose-Capillary: 250 mg/dL — ABNORMAL HIGH (ref 70–99)
Glucose-Capillary: 301 mg/dL — ABNORMAL HIGH (ref 70–99)
Glucose-Capillary: 305 mg/dL — ABNORMAL HIGH (ref 70–99)

## 2018-06-10 MED ORDER — INSULIN ASPART 100 UNIT/ML ~~LOC~~ SOLN: 0.0000 [IU] | Freq: Three times a day (TID) | SUBCUTANEOUS | Status: AC

## 2018-06-10 MED ORDER — LEVALBUTEROL HCL 1.25 MG/0.5ML IN NEBU: 1.2500 mg | INHALATION_SOLUTION | Freq: Two times a day (BID) | RESPIRATORY_TRACT | Status: DC

## 2018-06-10 MED ORDER — UMECLIDINIUM BROMIDE 62.5 MCG/INH IN AEPB
1.0000 | INHALATION_SPRAY | Freq: Every day | RESPIRATORY_TRACT | Status: DC
  Administered 2018-06-10: 1 via RESPIRATORY_TRACT
  Filled 2018-06-10: qty 7

## 2018-06-10 MED ORDER — ATORVASTATIN CALCIUM 20 MG PO TABS: 20.0000 mg | ORAL_TABLET | Freq: Every day | ORAL | Status: AC

## 2018-06-10 MED ORDER — TAMSULOSIN HCL 0.4 MG PO CAPS: 0.4000 mg | ORAL_CAPSULE | Freq: Every day | ORAL | Status: AC

## 2018-06-10 MED ORDER — CARVEDILOL 6.25 MG PO TABS: 6.2500 mg | ORAL_TABLET | Freq: Two times a day (BID) | ORAL | Status: AC

## 2018-06-10 MED ORDER — IPRATROPIUM BROMIDE 0.02 % IN SOLN: 0.5000 mg | Freq: Two times a day (BID) | RESPIRATORY_TRACT | Status: DC

## 2018-06-10 MED ORDER — GUAIFENESIN-DM 100-10 MG/5ML PO SYRP: 5.0000 mL | ORAL_SOLUTION | Freq: Four times a day (QID) | ORAL | Status: AC | PRN

## 2018-06-10 MED ORDER — FUROSEMIDE 40 MG PO TABS: 40.0000 mg | ORAL_TABLET | Freq: Every day | ORAL | Status: DC

## 2018-06-10 NOTE — Discharge Instructions (Signed)

## 2018-06-10 NOTE — Discharge Summary (Signed)
Physician Discharge Summary  Justin Christensen HER:[id number] DOB: 1931/01/26  PCP: [name]  Admit date: 05/21/2018 Discharge date: 06/10/2018  Recommendations for Outpatient Follow-up:  1. MD at SNF in 3 days with repeat labs (CBC & BMP). 2. Recommend palliative care consultation and follow-up at SNF. 3. PCP at the [facility], upon discharge from SNF.  Home Health: Patient being discharged to [facility], [place]. Equipment/Devices: TBD at SNF  Discharge Condition: Improved and stable CODE STATUS: Full Diet recommendation: Heart healthy & diabetic diet.  Discharge Diagnoses:  Principal Problem:   Urethral trauma Active Problems:   Hypoglycemia   CKD (chronic kidney disease), stage V (HCC)   HTN (hypertension)   AF (paroxysmal atrial fibrillation) (HCC)   COPD (chronic obstructive pulmonary disease) (HCC)   Chronic anemia   Thrombocytopenia (HCC)   BPH (benign prostatic hyperplasia)   Pulmonary hypertension, moderate to severe (HCC)   AKI (acute kidney injury) (Justin Christensen)   Acute blood loss anemia   Acute metabolic encephalopathy   Hematuria   Type 2 diabetes mellitus with stage 3 chronic kidney disease (HCC)   Dementia with behavioral disturbance ([place])   Brief Summary: Justin Christensen is an 85 y.o. male with a PMH of diabetes, thrombocytopenia, hypertension, COPD, pulmonary hypertension, PAF on Eliquis, BPH, nephrolithiasis, anemia, and stage III CKD who was admitted 05/21/2018 with a chief complaint of hematuria in the setting of 2 recent hospitalizations & for evaluation of AKI, and the other related to hypoglycemia from sulfonylurea use in the setting of advanced CKD.  Subsequently admitted to [facility] for hypoglycemia and volume overload.  He was catheterized for close fluid monitoring, and overnight on 05/20/2018, removed the catheter traumatically resulting in significant hematuria.  Eliquis was held.  Catheter was reinserted but despite vigorous  irrigation, continued to drain bloody urine and was associated with acute blood loss anemia and recurrent AKI.  He subsequently was admitted here for urological consultation.  Urology recommended continuing Foley catheter to drainage and irrigate as needed for clots/obstruction.  On 05/22/2018, he was noted to be confused.  On 05/23/2018, hemoglobin dropped to 6.6 mg/dL and the patient was ordered 2 units of PRBCs.  Posttransfusion hemoglobin was 8.5.  On 05/27/18, the patient's urine cleared sufficiently to allow removal of the catheter.  Eliquis was resumed on 05/28/2018.  On 05/30/2018, low-dose Lasix was resumed when creatinine improved to 2.26.  Patient has SNF bed and insurance approval.  Ongoing intermittent confusion but has not needed any medication since 12/26.  Assessment and plan:  Principal Problem:   Urethral trauma/hematuria associated with acute blood loss anemia in the setting of chronic anticoagulation Urology consulted and patient was maintained on Foley catheter drainage with PRN irrigation for blood clots.  He did receive 2 units of PRBCs when his hemoglobin dropped to 6.6 on 05/23/2018.  By 05/27/2018, the patient's urine sufficiently cleared to allow removal of the Foley.  Eliquis subsequently resumed on 05/28/2018. Stable.  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  This patient has been assessed and is determined to lack capacity based on the following criteria: He does not understand his treatment/care options or how it applies to his condition, he does not understand the risk or benefits associated with treatment options, he does not understand the potential consequences associated with refusing treatment and his decision-making capacity appears to be impaired.  He continues to assert that he can manage his affairs at  home when it is clear that he has significant physical debility. Ongoing confusion but  no agitation and he has not required any PRN meds except 1 dose of Ativan on 12/26.  Although confused, he is not agitated.  Follow delirium precautions at SNF and minimize medications that might precipitate it including opioids or benzodiazepines unless absolutely necessary.    Diabetes with history of hypoglycemic episodes Patient on sliding scale insulin prior to admission.  In the hospital placed on pretty high doses of Lantus, SSI and eventually mealtime NovoLog.  Hyperglycemia likely precipitated by steroids.  A1c on 03/17/2018: 7.2. Since last weekend, patient started experiencing hypoglycemic events, continued despite cutting back on insulins. Hypothermia was likely related to hypoglycemia.    Hypoglycemia now resolved and CBGs mildly uncontrolled and fluctuating as below.  Continue NovoLog SSI.  Reluctant to add even low-dose Lantus given history of hypoglycemia.  Monitor closely at SNF and adjust insulin doses as needed.    CKD (chronic kidney disease), stage III-IV (HCC)/AKI Creatinine has been fluctuating, likely related to Lasix and not sure if he has a consistent oral intake.  His most recent baseline probably is in the 2.4 range.  Continue Lasix and monitor BMP closely.    HTN (hypertension) Continue hydralazine, Norvasc and Coreg.  Reasonable control.    AF (paroxysmal atrial fibrillation) (HCC) Rate controlled on Coreg, anticoagulated with Eliquis.    COPD (chronic obstructive pulmonary disease) (HCC) with acute exacerbation/likely chronic hypoxic respiratory failure. COPD exacerbation is clinically resolved.  Completed steroid taper in the hospital today.  This may also be decreasing his insulin requirement.  Continue oxygen supplementation and titrate saturations to 88-92%.    Chronic anemia Hemoglobin has dropped from 10.4 > 9.4 > 8.4 in the absence of overt bleeding.  Hemoglobin stable today.  Leukocytosis improving, may be related to steroids.  Follow CBCs periodically.   No bleeding reported.    Thrombocytopenia (HCC) Resolved, platelet count WNL.    BPH (benign prostatic hyperplasia) Good urine output with Foley removal.  Continue Flomax.    Pulmonary hypertension, moderate to severe (HCC) Chronic, stable.    Acute metabolic encephalopathy Likely related to AKI/possible uremia complicating underlying dementia.  Intermittent confusion ongoing, possibly due to dementia now.     Hyperlipidemia Lipitor substituted for Zocor due to concurrent treatment with Norvasc and risk for myalgia.    Discharging on Lipitor.   Consultations:  Urology  Procedures:  As above   Discharge Instructions  Discharge Instructions    (HEART FAILURE PATIENTS) Call MD:  Anytime you have any of the following symptoms: 1) 3 pound weight gain in 24 hours or 5 pounds in 1 week 2) shortness of breath, with or without a dry hacking cough 3) swelling in the hands, feet or stomach 4) if you have to sleep on extra pillows at night in order to breathe.   Complete by:  As directed    Call MD for:   Complete by:  As directed    Worsening confusion or altered mental status.   Call MD for:  difficulty breathing, headache or visual disturbances   Complete by:  As directed    Call MD for:  extreme fatigue   Complete by:  As directed    Call MD for:  persistant dizziness or light-headedness   Complete by:  As directed    Call MD for:  persistant nausea and vomiting   Complete by:  As directed    Call MD for:  severe uncontrolled  pain   Complete by:  As directed    Call MD for:  temperature >100.4   Complete by:  As directed    Diet - low sodium heart healthy   Complete by:  As directed    Diet Carb Modified   Complete by:  As directed    Increase activity slowly   Complete by:  As directed        Medication List    STOP taking these medications   DULoxetine 30 MG capsule Commonly known as:  CYMBALTA   HYDROcodone-acetaminophen 5-325 MG tablet Commonly known  as:  NORCO/VICODIN   simvastatin 40 MG tablet Commonly known as:  ZOCOR   terazosin 5 MG capsule Commonly known as:  HYTRIN     TAKE these medications   acetaminophen 325 MG tablet Commonly known as:  TYLENOL Take 325 mg by mouth every 6 (six) hours as needed (for pain or headaches).   albuterol (2.5 MG/3ML) 0.083% nebulizer solution Commonly known as:  PROVENTIL Take 2.5 mg by nebulization every 6 (six) hours as needed for wheezing or shortness of breath.   PROVENTIL HFA 108 (90 Base) MCG/ACT inhaler Generic drug:  albuterol Inhale 1-2 puffs into the lungs every 6 (six) hours as needed for wheezing or shortness of breath.   amLODipine 5 MG tablet Commonly known as:  NORVASC Take 1 tablet (5 mg total) by mouth daily.   atorvastatin 20 MG tablet Commonly known as:  LIPITOR Take 1 tablet (20 mg total) by mouth at bedtime.   carvedilol 6.25 MG tablet Commonly known as:  COREG Take 1 tablet (6.25 mg total) by mouth 2 (two) times daily with a meal.   ELIQUIS 2.5 MG Tabs tablet Generic drug:  apixaban Take 2.5 mg by mouth 2 (two) times daily.   feeding supplement (GLUCERNA SHAKE) Liqd Take 237 mLs by mouth 3 (three) times daily between meals.   furosemide 40 MG tablet Commonly known as:  LASIX Take 1 tablet (40 mg total) by mouth daily. What changed:    when to take this  Another medication with the same name was removed. Continue taking this medication, and follow the directions you see here.   guaiFENesin-dextromethorphan 100-10 MG/5ML syrup Commonly known as:  ROBITUSSIN DM Take 5 mLs by mouth every 6 (six) hours as needed for cough.   hydrALAZINE 25 MG tablet Commonly known as:  APRESOLINE Take 2 tablets (50 mg total) by mouth 3 (three) times daily. What changed:  Another medication with the same name was removed. Continue taking this medication, and follow the directions you see here.   insulin aspart 100 UNIT/ML injection Commonly known as:  novoLOG Inject  0-9 Units into the skin 3 (three) times daily with meals. CBG < 70: implement hypoglycemia protocol CBG 70 - 120: 0 units CBG 121 - 150: 1 unit CBG 151 - 200: 2 units CBG 201 - 250: 3 units CBG 251 - 300: 5 units CBG 301 - 350: 7 units CBG 351 - 400: 9 units CBG > 400: call MD. What changed:    how much to take  how to take this  when to take this  additional instructions   ONE-A-DAY MENS 50+ ADVANTAGE Tabs Take 1 tablet by mouth daily.   polyethylene glycol packet Commonly known as:  MIRALAX / GLYCOLAX Take 17 g by mouth daily.   senna-docusate 8.6-50 MG tablet Commonly known as:  Senokot-S Take 1 tablet by mouth 2 (two) times daily.   simethicone 80  MG chewable tablet Commonly known as:  MYLICON Chew 80 mg by mouth every 6 (six) hours as needed for flatulence.   SPIRIVA RESPIMAT 1.25 MCG/ACT Aers Generic drug:  Tiotropium Bromide Monohydrate Inhale 1 puff into the lungs daily.   tamsulosin 0.4 MG Caps capsule Commonly known as:  FLOMAX Take 1 capsule (0.4 mg total) by mouth daily. Start taking on:  June 11, 2018       Contact information for follow-up providers    MD at [facility]. Schedule an appointment as soon as possible for a visit in 3 day(s).   Why:  To be seen with repeat labs (CBC & BMP).  Recommend palliative care consultation at SNF for goals of care.       [facility] Schedule an appointment as soon as possible for a visit.   Why:  Upon discharge from SNF. Contact information: [place] [address] [phone]            Contact information for after-discharge care    Destination    [facility] .   Service:  Skilled Nursing Contact information: [address] [place] [phone]                 No Known Allergies    Procedures/Studies: Dg Chest 2 View  Result Date: 05/22/2018 CLINICAL DATA:  Acute kidney injury.  EXAM: CHEST - 2 VIEW COMPARISON:  Chest radiograph May 17, 2018 FINDINGS: Cardiac silhouette is upper limits of normal in size. Calcified aortic arch. Small RIGHT pleural effusion. Bronchitic changes/interstitial changes. No pneumothorax. Soft tissue planes and included osseous structures are non suspicious. IMPRESSION: Small RIGHT pleural effusion. Similar bronchitic changes/chronic interstitial lung disease. Aortic Atherosclerosis (ICD10-I70.0). Electronically Signed   By: Elon Alas M.D.   On: 05/22/2018 00:33      Subjective: Patient denies complaints.  Keeps insisting on being discharged.  Specifically denies dyspnea, cough or chest pain.  As per RN, no acute issues noted.  No further hypoglycemic episodes.  Tolerating diet well.  Discharge Exam:  Vitals:   06/10/18 0313 06/10/18 0618 06/10/18 0825 06/10/18 1345  BP: (!) 122/52   122/67  Pulse: 83   66  Resp: 20   19  Temp: (!) 100.6 F (38.1 C) 99.8 F (37.7 C)  98.4 F (36.9 C)  TempSrc: Axillary Axillary  Oral  SpO2: (!) 86%  92% 96%  Weight:      Height:       General: Pleasant elderly male, moderately built and frail, chronically ill looking, lying comfortably supine in bed without distress. Cardiovascular: S1 and S2 heard, RRR.  No JVD, murmurs or pedal edema. Lungs:  Good breath sounds bilaterally.  Few bibasal Velcro-like coarse appearing crackles.  Rest of lung fields without wheezing, rhonchi or crackles.  No increased work of breathing.  Abdomen: Soft, nontender, nondistended with normal active bowel sounds. No masses. No hepatosplenomegaly. Skin: Warm and dry. No rashes or lesions. Extremities: No clubbing or cyanosis. No edema. Pedal pulses 2+.  Symmetric 5 x 5 power. CNS: Alert and oriented only to self.  No focal neurological deficits.   The results of significant diagnostics from this hospitalization (including imaging, microbiology, ancillary and laboratory) are listed below for reference.      Labs: CBC: Recent Labs  Lab 06/06/18 0538 06/08/18 0544 06/09/18 0457 06/10/18 0421  WBC 12.8* 17.0* 18.1* 14.5*  HGB 10.4* 9.4* 8.4* 8.3*  HCT 33.5* 30.6* 28.4*  27.7*  MCV 101.2* 104.4* 105.2* 106.1*  PLT 211 180 184 573   Basic Metabolic Panel: Recent Labs  Lab 06/04/18 0424 06/06/18 0538 06/07/18 0429 06/08/18 0544 06/10/18 0421  NA  --  140 138 138 138  K  --  4.7 4.5 4.3 4.8  CL  --  97* 96* 98 98  CO2  --  35* 31 32 28  GLUCOSE  --  187* 219* 137* 292*  BUN  --  96* 96* 92* 98*  CREATININE 2.17* 2.50* 2.30* 2.16* 2.70*  CALCIUM  --  8.3* 8.6* 8.2* 8.1*    BNP (last 3 results) Recent Labs    05/21/18 2251  BNP 615.1*   CBG: Recent Labs  Lab 06/09/18 1138 06/09/18 1614 06/09/18 2105 06/10/18 0737 06/10/18 1132  GLUCAP 224* 165* 191* 250* 301*   I discussed in detail with patient's daughter via phone, updated care and answered questions.  Updated his overall poor long-term prognosis given advanced age, frail physical health and multiple severe significant comorbidities.  Recommended palliative care consultation at SNF which she understands.  She has patient's healthcare power of attorney but does not have general power of attorney and advised that she may have to seek legal help for same.  She verbalized understanding.  Time coordinating discharge: 50 minutes  SIGNED:  Vernell Leep, MD, FACP, [facility]. [facility] Pager [phone]  If 7PM-7AM, please contact night-coverage [email or web address] Password [email or web address] 06/10/2018, 2:35 PM

## 2018-06-10 NOTE — Progress Notes (Signed)
Patient with low grade axillary temp overnight. NP on call was notified. No new orders were received. Will continue to monitor patient.

## 2018-06-10 NOTE — Progress Notes (Signed)
Physical Therapy Treatment Patient Details Name: Justin Christensen MRN: [id number] DOB: 09/20/1930 Today's Date: 06/10/2018    History of Present Illness 85 y.o. male admitted with hypoglycemia, confusion, and hematuria. PMH significant for DM, mild thrombocytopenia, HTN, COPD, pulmonary HTN, atrial fibrillation on eliquis, nephrolithiasis, CKD and anemia.    PT Comments    Pt ambulated within room and distance limited by fatigue and knee pain.  Pt remained on supplemental oxygen for session.  Pt left up in recliner with NT end of session.  Continue to recommend SNF upon d/c.  Follow Up Recommendations  SNF     Equipment Recommendations  Rolling walker with 5" wheels    Recommendations for Other Services       Precautions / Restrictions Precautions Precautions: Fall Precaution Comments: currently on oxygen, pt reports he does not wear O2 at baseline    Mobility  Bed Mobility Overal bed mobility: Needs Assistance Bed Mobility: Sit to Supine     Supine to sit: Mod assist;HOB elevated     General bed mobility comments: assist to initiate LE movement however pt able to bring over EOB, assist for trunk upright  Transfers Overall transfer level: Needs assistance Equipment used: Rolling walker (2 wheeled) Transfers: Sit to/from Stand Sit to Stand: Min assist         General transfer comment: assist to rise and steady as well as control descent, multimodal cues for safe technique  Ambulation/Gait Ambulation/Gait assistance: Min assist Gait Distance (Feet): 12 Feet Assistive device: Rolling walker (2 wheeled) Gait Pattern/deviations: Step-to pattern;Trunk flexed Gait velocity: decr   General Gait Details: very slow gait, assist for negotiating RW especially with turns, maintained 3L O2  (long line), bil knee varus deformity observed, required step by step cues and increased time   Stairs             Wheelchair Mobility    Modified Rankin (Stroke Patients  Only)       Balance                                            Cognition Arousal/Alertness: Awake/alert Behavior During Therapy: WFL for tasks assessed/performed Overall Cognitive Status: Impaired/Different from baseline Area of Impairment: Memory;Following commands;Problem solving                     Memory: Decreased short-term memory;Decreased recall of precautions Following Commands: Follows one step commands with increased time     Problem Solving: Difficulty sequencing;Requires verbal cues;Requires tactile cues General Comments: pt required multimodal cues, agreeable to work with PT however used functional tasks to motivate       Exercises      General Comments        Pertinent Vitals/Pain Pain Assessment: Faces Faces Pain Scale: Hurts even more Pain Location: knees and back Pain Descriptors / Indicators: Aching;Sore Pain Intervention(s): Premedicated before session;Monitored during session;Limited activity within patient's tolerance    Home Living                      Prior Function            PT Goals (current goals can now be found in the care plan section) Acute Rehab PT Goals PT Goal Formulation: With patient Time For Goal Achievement: 06/24/18 Potential to Achieve Goals: Fair Progress towards PT goals: Progressing toward goals  Frequency    Min 2X/week      PT Plan Current plan remains appropriate    Co-evaluation              AM-PAC PT "6 Clicks" Mobility   Outcome Measure  Help needed turning from your back to your side while in a flat bed without using bedrails?: A Little Help needed moving from lying on your back to sitting on the side of a flat bed without using bedrails?: A Little Help needed moving to and from a bed to a chair (including a wheelchair)?: A Little Help needed standing up from a chair using your arms (e.g., wheelchair or bedside chair)?: A Little Help needed to walk in  hospital room?: A Lot Help needed climbing 3-5 steps with a railing? : A Lot 6 Click Score: 16    End of Session Equipment Utilized During Treatment: Gait belt;Oxygen Activity Tolerance: Patient limited by fatigue Patient left: with call bell/phone within reach;with nursing/sitter in room;in chair;with chair alarm set Nurse Communication: Mobility status(NT in room end of session for bathing) PT Visit Diagnosis: Muscle weakness (generalized) (M62.81);Difficulty in walking, not elsewhere classified (R26.2)     Time: 3779-3968 PT Time Calculation (min) (ACUTE ONLY): 16 min  Charges:  $Gait Training: 8-22 mins            Carmelia Bake, PT, [place] Office: [phone] Pager: [phone]  Trena Platt 06/10/2018, 2:30 PM

## 2018-06-10 NOTE — Clinical Social Work Placement (Addendum)
[facility].  Daughter [name] notified.  D/C Summary sent.  Nurse call report to: [phone]  CLINICAL SOCIAL WORK PLACEMENT  NOTE  Date:  06/10/2018  Patient Details  Name: Justin Christensen MRN: [id number] Date of Birth: 1931-04-16  Clinical Social Work is seeking post-discharge placement for this patient at the [place] level of care (*CSW will initial, date and re-position this form in  chart as items are completed):  Yes   Patient/family provided with [place] Work Department's list of facilities offering this level of care within the geographic area requested by the patient (or if unable, by the patient's family).  Yes   Patient/family informed of their freedom to choose among providers that offer the needed level of care, that participate in Medicare, Medicaid or managed care program needed by the patient, have an available bed and are willing to accept the patient.  Yes   Patient/family informed of [place]'s ownership interest in [facility] and [facility], as well as of the fact that they are under no obligation to receive care at these facilities.  PASRR submitted to EDS on       PASRR number received on       Existing PASRR number confirmed on 05/26/18     FL2 transmitted to all facilities in geographic area requested by pt/family on       FL2 transmitted to all facilities within larger geographic area on 05/26/18     Patient informed that his/her managed care company has contracts with or will negotiate with certain facilities, including the following:  [facility]     Yes   Patient/family informed of bed offers received.  Patient chooses bed at [facility]     Physician recommends and patient chooses bed at      Patient to be transferred to [facility] on 06/10/18.  Patient to be transferred to facility by [facility]      Patient family  notified on 06/10/18 of transfer.  Name of family member notified:  Daughter-Susan     PHYSICIAN Please prepare priority discharge summary, including medications     Additional Comment:    _______________________________________________ Lia Hopping, LCSW 06/10/2018, 11:11 AM

## 2018-06-22 ENCOUNTER — Emergency Department (HOSPITAL_COMMUNITY)
Admission: EM | Admit: 2018-06-22 | Discharge: 2018-06-22 | Disposition: A | Discharge status: Home / Self Care | Payer: Non-veteran care | Attending: Emergency Medicine | Admitting: Emergency Medicine

## 2018-06-22 ENCOUNTER — Encounter (HOSPITAL_COMMUNITY): Payer: Self-pay | Admitting: Emergency Medicine

## 2018-06-22 DIAGNOSIS — F0390 Unspecified dementia without behavioral disturbance: Secondary | ICD-10-CM | POA: Insufficient documentation

## 2018-06-22 DIAGNOSIS — J449 Chronic obstructive pulmonary disease, unspecified: Secondary | ICD-10-CM | POA: Insufficient documentation

## 2018-06-22 DIAGNOSIS — Z79899 Other long term (current) drug therapy: Secondary | ICD-10-CM | POA: Insufficient documentation

## 2018-06-22 DIAGNOSIS — Z794 Long term (current) use of insulin: Secondary | ICD-10-CM | POA: Insufficient documentation

## 2018-06-22 DIAGNOSIS — N184 Chronic kidney disease, stage 4 (severe): Secondary | ICD-10-CM | POA: Insufficient documentation

## 2018-06-22 DIAGNOSIS — I509 Heart failure, unspecified: Secondary | ICD-10-CM | POA: Insufficient documentation

## 2018-06-22 DIAGNOSIS — Z7901 Long term (current) use of anticoagulants: Secondary | ICD-10-CM | POA: Insufficient documentation

## 2018-06-22 DIAGNOSIS — I130 Hypertensive heart and chronic kidney disease with heart failure and stage 1 through stage 4 chronic kidney disease, or unspecified chronic kidney disease: Secondary | ICD-10-CM | POA: Insufficient documentation

## 2018-06-22 DIAGNOSIS — E1122 Type 2 diabetes mellitus with diabetic chronic kidney disease: Secondary | ICD-10-CM | POA: Insufficient documentation

## 2018-06-22 HISTORY — DX: Anxiety disorder, unspecified: F41.9

## 2018-06-22 LAB — CBC WITH DIFFERENTIAL/PLATELET
Abs Immature Granulocytes: 0.03 10*3/uL (ref 0.00–0.07)
Basophils Absolute: 0 10*3/uL (ref 0.0–0.1)
Basophils Relative: 0 %
Eosinophils Absolute: 0.1 10*3/uL (ref 0.0–0.5)
Eosinophils Relative: 2 %
HCT: 28.5 % — ABNORMAL LOW (ref 39.0–52.0)
Hemoglobin: 8.5 g/dL — ABNORMAL LOW (ref 13.0–17.0)
Immature Granulocytes: 1 %
LYMPHS ABS: 0.6 10*3/uL — AB (ref 0.7–4.0)
Lymphocytes Relative: 11 %
MCH: 31.6 pg (ref 26.0–34.0)
MCHC: 29.8 g/dL — ABNORMAL LOW (ref 30.0–36.0)
MCV: 105.9 fL — ABNORMAL HIGH (ref 80.0–100.0)
Monocytes Absolute: 0.4 10*3/uL (ref 0.1–1.0)
Monocytes Relative: 8 %
Neutro Abs: 4.2 10*3/uL (ref 1.7–7.7)
Neutrophils Relative %: 78 %
Platelets: 176 10*3/uL (ref 150–400)
RBC: 2.69 MIL/uL — ABNORMAL LOW (ref 4.22–5.81)
RDW: 15.4 % (ref 11.5–15.5)
WBC: 5.3 10*3/uL (ref 4.0–10.5)
nRBC: 0 % (ref 0.0–0.2)

## 2018-06-22 LAB — BASIC METABOLIC PANEL
Anion gap: 10 (ref 5–15)
BUN: 63 mg/dL — ABNORMAL HIGH (ref 8–23)
CHLORIDE: 101 mmol/L (ref 98–111)
CO2: 29 mmol/L (ref 22–32)
Calcium: 8.3 mg/dL — ABNORMAL LOW (ref 8.9–10.3)
Creatinine, Ser: 2.94 mg/dL — ABNORMAL HIGH (ref 0.61–1.24)
GFR calc Af Amer: 21 mL/min — ABNORMAL LOW (ref >60)
GFR calc non Af Amer: 19 mL/min — ABNORMAL LOW (ref >60)
Glucose, Bld: 276 mg/dL — ABNORMAL HIGH (ref 70–99)
Potassium: 3.9 mmol/L (ref 3.5–5.1)
Sodium: 140 mmol/L (ref 135–145)

## 2018-06-22 MED ORDER — SODIUM CHLORIDE 0.9 % IV BOLUS
500.0000 mL | Freq: Once | INTRAVENOUS | Status: AC
  Administered 2018-06-22: 500 mL via INTRAVENOUS

## 2018-06-22 NOTE — ED Notes (Signed)
Bed: [facility] Expected date:  Expected time:  Means of arrival:  Comments: 

## 2018-06-22 NOTE — ED Notes (Signed)
Bed: YJ85 Expected date:  Expected time:  Means of arrival:  Comments: Hold for triage

## 2018-06-22 NOTE — ED Provider Notes (Addendum)
[facility] DEPT Provider Note   CSN: [id number] Arrival date & time: 06/22/18  1302     History   Chief Complaint Chief Complaint  Patient presents with  . elevated CRT    HPI Justin Christensen is a 85 y.o. male.  HPI  85 year old male comes in with chief complaint of elevated creatinine. Patient has history of diabetes, chronic kidney disease, pulmonary hypertension, A. fib, COPD, CHF. Patient was discharged to SNF on January 2.  He was sent to the ER after his creatinine was found to be elevated at 3.1 -in the facility or the primary care doctor wanted patient to get IV fluids.  Patient denies any vomiting, diarrhea.  He has no complaints from his side at this time.  Past Medical History:  Diagnosis Date  . Anxiety     Patient Active Problem List   Diagnosis Date Noted  . Dementia with behavioral disturbance ([place]) 06/04/2018  . Hematuria 06/02/2018  . Type 2 diabetes mellitus with stage 3 chronic kidney disease ([place]) 06/02/2018  . AKI (acute kidney injury) ([place])   . Acute blood loss anemia   . Acute metabolic encephalopathy   . Urethral trauma 05/21/2018  . Pulmonary hypertension, moderate to severe ([place]) 05/21/2018  . Hypoglycemia 03/17/2018  . CKD (chronic kidney disease), stage V ([place]) 03/17/2018  . HTN (hypertension) 03/17/2018  . HLD (hyperlipidemia) 03/17/2018  . AF (paroxysmal atrial fibrillation) ([place]) 03/17/2018  . COPD (chronic obstructive pulmonary disease) ([place]) 03/17/2018  . CHF (congestive heart failure) ([place]) 03/17/2018  . Chronic anemia 03/17/2018  . Thrombocytopenia ([place]) 03/17/2018  . BPH (benign prostatic hyperplasia) 03/17/2018  . Depression 03/17/2018  . Chronic pain 03/17/2018    History reviewed. No pertinent surgical history.      Home Medications    Prior to Admission medications   Medication Sig Start Date End Date Taking? Authorizing Provider  acetaminophen (TYLENOL) 325 MG tablet Take 325 mg by  mouth every 6 (six) hours as needed (for pain or headaches).    [provider]  albuterol (PROVENTIL HFA) 108 (90 Base) MCG/ACT inhaler Inhale 1-2 puffs into the lungs every 6 (six) hours as needed for wheezing or shortness of breath.    [provider]  albuterol (PROVENTIL) (2.5 MG/3ML) 0.083% nebulizer solution Take 2.5 mg by nebulization every 6 (six) hours as needed for wheezing or shortness of breath.    [provider]  amLODipine (NORVASC) 5 MG tablet Take 1 tablet (5 mg total) by mouth daily. 03/20/18   Ghimire, Henreitta Leber, MD  apixaban (ELIQUIS) 2.5 MG TABS tablet Take 2.5 mg by mouth 2 (two) times daily.    [provider]  atorvastatin (LIPITOR) 20 MG tablet Take 1 tablet (20 mg total) by mouth at bedtime. 06/10/18   Hongalgi, Lenis Dickinson, MD  carvedilol (COREG) 6.25 MG tablet Take 1 tablet (6.25 mg total) by mouth 2 (two) times daily with a meal. 06/10/18   Hongalgi, Lenis Dickinson, MD  feeding supplement, GLUCERNA SHAKE, (GLUCERNA SHAKE) LIQD Take 237 mLs by mouth 3 (three) times daily between meals. 03/20/18   Ghimire, Henreitta Leber, MD  furosemide (LASIX) 40 MG tablet Take 1 tablet (40 mg total) by mouth daily. 05/29/18   Regalado, Belkys A, MD  guaiFENesin-dextromethorphan (ROBITUSSIN DM) 100-10 MG/5ML syrup Take 5 mLs by mouth every 6 (six) hours as needed for cough. 06/10/18   Hongalgi, Lenis Dickinson, MD  hydrALAZINE (APRESOLINE) 25 MG tablet Take 2 tablets (50 mg total) by  mouth 3 (three) times daily. Patient not taking: Reported on 05/22/2018 03/20/18   Jonetta Osgood, MD  insulin aspart (NOVOLOG) 100 UNIT/ML injection Inject 0-9 Units into the skin 3 (three) times daily with meals. CBG < 70: implement hypoglycemia protocol CBG 70 - 120: 0 units CBG 121 - 150: 1 unit CBG 151 - 200: 2 units CBG 201 - 250: 3 units CBG 251 - 300: 5 units CBG 301 - 350: 7 units CBG 351 - 400: 9 units CBG > 400: call MD. 06/10/18   Modena Jansky, MD  Multiple Vitamins-Minerals  (ONE-A-DAY MENS 50+ ADVANTAGE) TABS Take 1 tablet by mouth daily.    [provider]  polyethylene glycol (MIRALAX / GLYCOLAX) packet Take 17 g by mouth daily. 05/29/18   Regalado, Belkys A, MD  senna-docusate (SENOKOT-S) 8.6-50 MG tablet Take 1 tablet by mouth 2 (two) times daily. 05/29/18   Regalado, Belkys A, MD  simethicone (MYLICON) 80 MG chewable tablet Chew 80 mg by mouth every 6 (six) hours as needed for flatulence.    [provider]  tamsulosin (FLOMAX) 0.4 MG CAPS capsule Take 1 capsule (0.4 mg total) by mouth daily. 06/11/18   Hongalgi, Lenis Dickinson, MD  Tiotropium Bromide Monohydrate (SPIRIVA RESPIMAT) 1.25 MCG/ACT AERS Inhale 1 puff into the lungs daily.    [provider]    Family History No family history on file.  Social History Social History   Tobacco Use  . Smoking status: Never Smoker  . Smokeless tobacco: Never Used  Substance Use Topics  . Alcohol use: Not Currently    Frequency: Never  . Drug use: Never     Allergies   Patient has no known allergies.   Review of Systems Review of Systems  Unable to perform ROS: Dementia     Physical Exam Updated Vital Signs BP (!) 130/54   Pulse 79   Temp 98.6 F (37 C) (Oral)   Resp (!) 27   Ht 5\' 6"  (1.676 m)   SpO2 96%   BMI 26.62 kg/m   Physical Exam Vitals signs and nursing note reviewed.  Constitutional:      Appearance: He is well-developed.  HENT:     Head: Atraumatic.  Neck:     Musculoskeletal: Neck supple.  Cardiovascular:     Rate and Rhythm: Normal rate.  Pulmonary:     Effort: Pulmonary effort is normal.  Skin:    General: Skin is warm.  Neurological:     Mental Status: He is alert and oriented to person, place, and time.     ED Treatments / Results  Labs (all labs ordered are listed, but only abnormal results are displayed) Labs Reviewed  CBC WITH DIFFERENTIAL/PLATELET - Abnormal; Notable for the following components:      Result Value   RBC 2.69 (*)     Hemoglobin 8.5 (*)    HCT 28.5 (*)    MCV 105.9 (*)    MCHC 29.8 (*)    Lymphs Abs 0.6 (*)    All other components within normal limits  BASIC METABOLIC PANEL - Abnormal; Notable for the following components:   Glucose, Bld 276 (*)    BUN 63 (*)    Creatinine, Ser 2.94 (*)    Calcium 8.3 (*)    GFR calc non Af Amer 19 (*)    GFR calc Af Amer 21 (*)    All other components within normal limits    EKG None  Radiology No results  found.  Procedures Procedures (including critical care time)  Medications Ordered in ED Medications  sodium chloride 0.9 % bolus 500 mL (500 mLs Intravenous New Bag/Given 06/22/18 1548)  sodium chloride 0.9 % bolus 500 mL (0 mLs Intravenous Stopped 06/22/18 1548)     Initial Impression / Assessment and Plan / ED Course  I have reviewed the triage vital signs and the nursing notes.  Pertinent labs & imaging results that were available during my care of the patient were reviewed by me and considered in my medical decision making (see chart for details).     Pt comes in with cc of worsening Creatinine. Pt's baseline Cr is 2.75 - today it was 3.1. He was discharge from the hospital recently to an assisted living type facility according to the daughter.  However, patient's needs at that facility exceeds what they can provide, and it seems like family wants to take him home.  We have given patient a liter of fluid and he has also passed oral challenge. Just spoke with patient's daughter at [phone].  She informs me that she would appreciate social worker consultation along with home health.  Face-to-face order has been placed and the social worker consult has been also placed.  We discussed the results of the ED with the patient's daughter and she is comfortable with the plan of patient going home.  If she changes her mind she can notify her social worker who can seek placement.  Final Clinical Impressions(s) / ED Diagnoses   Final diagnoses:  CKD  (chronic kidney disease) stage 4, GFR 15-29 ml/min [facility])    ED Discharge Orders    None       Varney Biles, MD 06/22/18 1629    Varney Biles, MD 06/22/18 1631

## 2018-06-22 NOTE — ED Triage Notes (Signed)
Pt sent from [facility] in [place] for elevated CRT 3.10. pt denies dysuria or blood in urine.  Pt c/o of right shoulder pain that had for a while.

## 2018-06-22 NOTE — Discharge Instructions (Addendum)
You are seen in the ER for worsening creatinine. We gave you some IV fluids, and we recommend that you follow-up with your primary care doctor in 3 to 5 days for repeat lab evaluation.

## 2018-06-22 NOTE — Progress Notes (Signed)
CSW briefly spoke with patients daughter, [name], who is currently en route to [facility]. Patient requested to speak with CSW once she has arrived to ED which will be in about 40 minutes. Daughter has CSW's number and will notify this Probation officer once she has arrived.   Kingsley Spittle, LCSW Clinical Social Worker  [facility] [employer]  [phone]

## 2018-06-22 NOTE — ED Notes (Signed)
Patient family was given discharge instructions and verbalized understanding. Patient was taken out of ED with wheelchair.

## 2018-06-23 ENCOUNTER — Telehealth: Payer: Self-pay | Admitting: *Deleted

## 2018-06-23 NOTE — Telephone Encounter (Signed)
EDCM spoke with Avon Gully, liaison at [facility] to get referral for [facility] services.  [place] requested EDP notes and orders to process request.  Theadora Rama states [facility] is preferred [facility] agency.  EDCM reached out to [facility] form [facility] who accepted referral and will reach out to pt.

## 2018-09-08 DEATH — deceased

## 2020-03-24 IMAGING — DX DG CHEST 2V
2 series · 2 of 2 positions shown · non-contrast
Comparison: Chest radiograph May 17, 2018

CLINICAL DATA: Acute kidney injury.

EXAM:
CHEST - 2 VIEW

[chest ap]
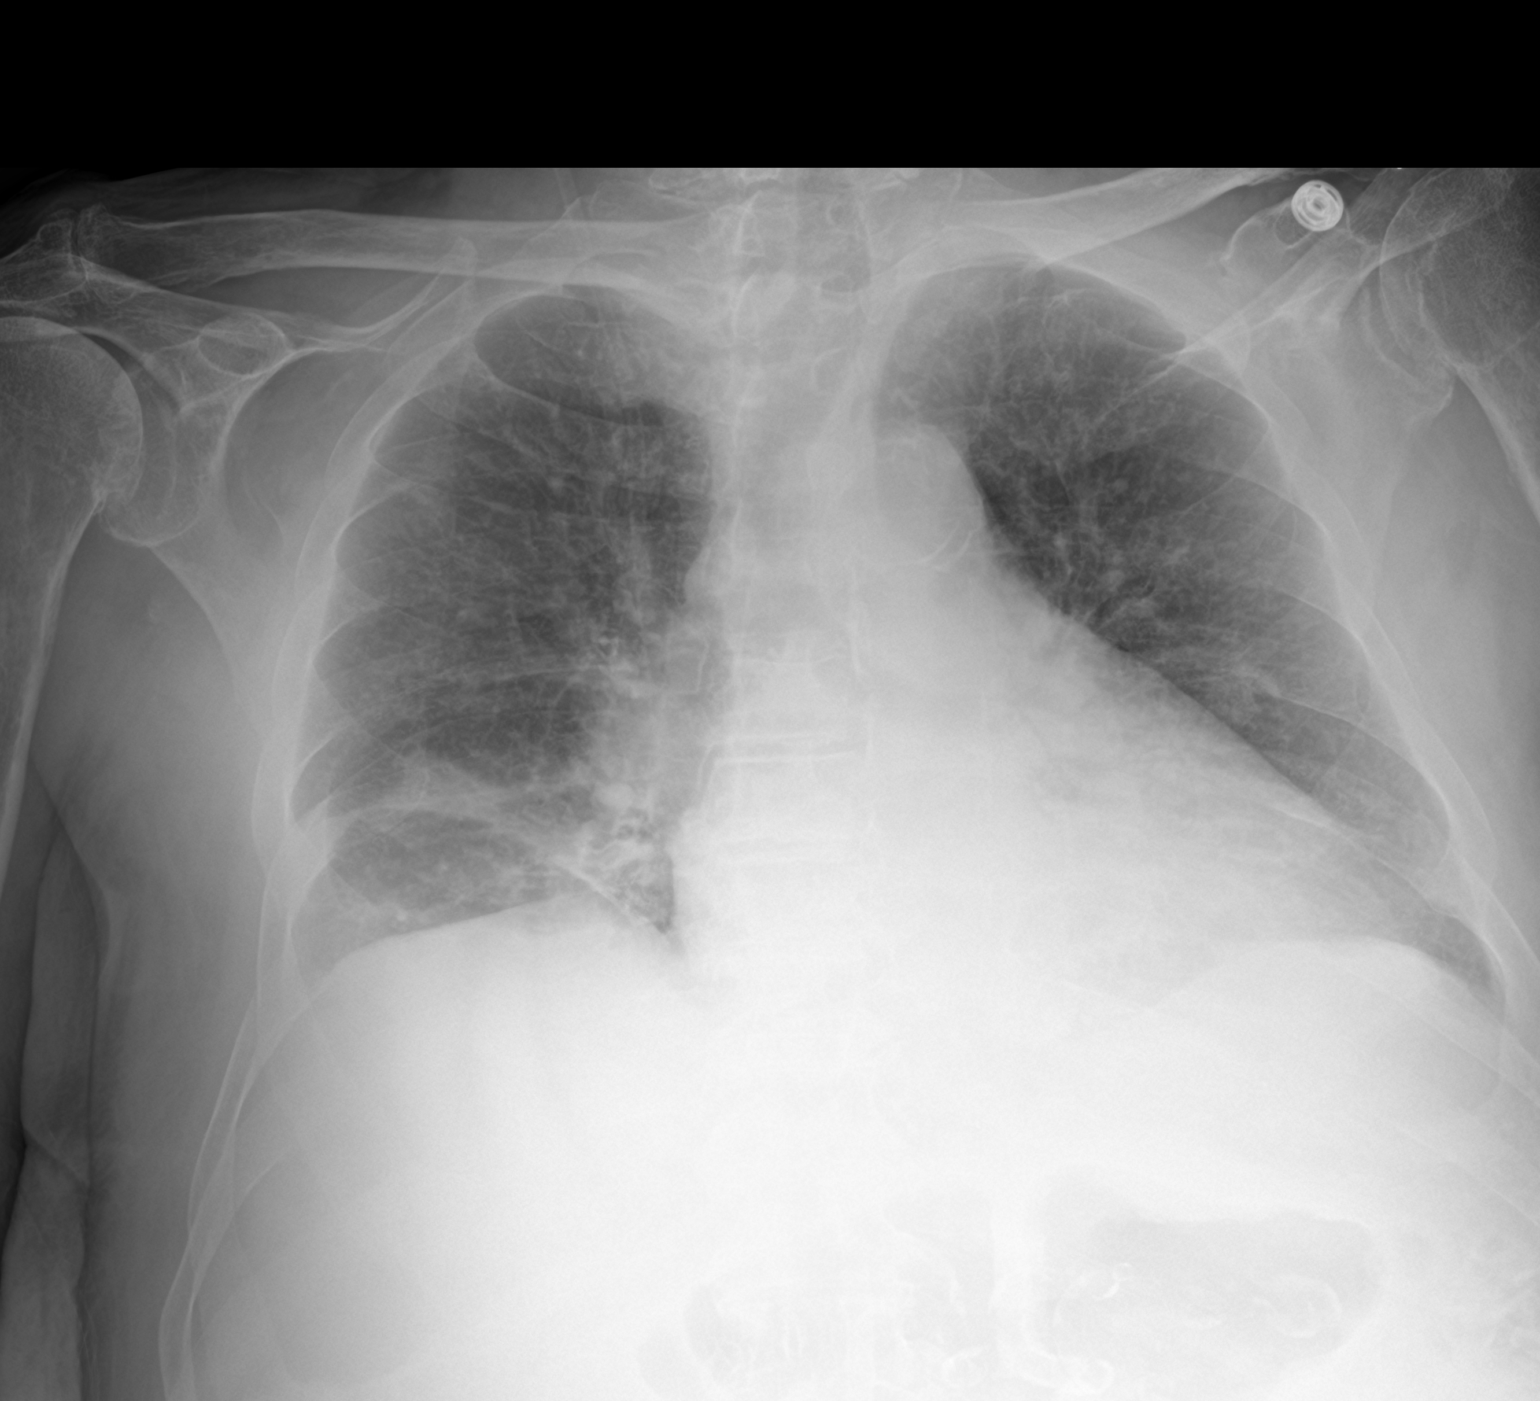

[chest lat]
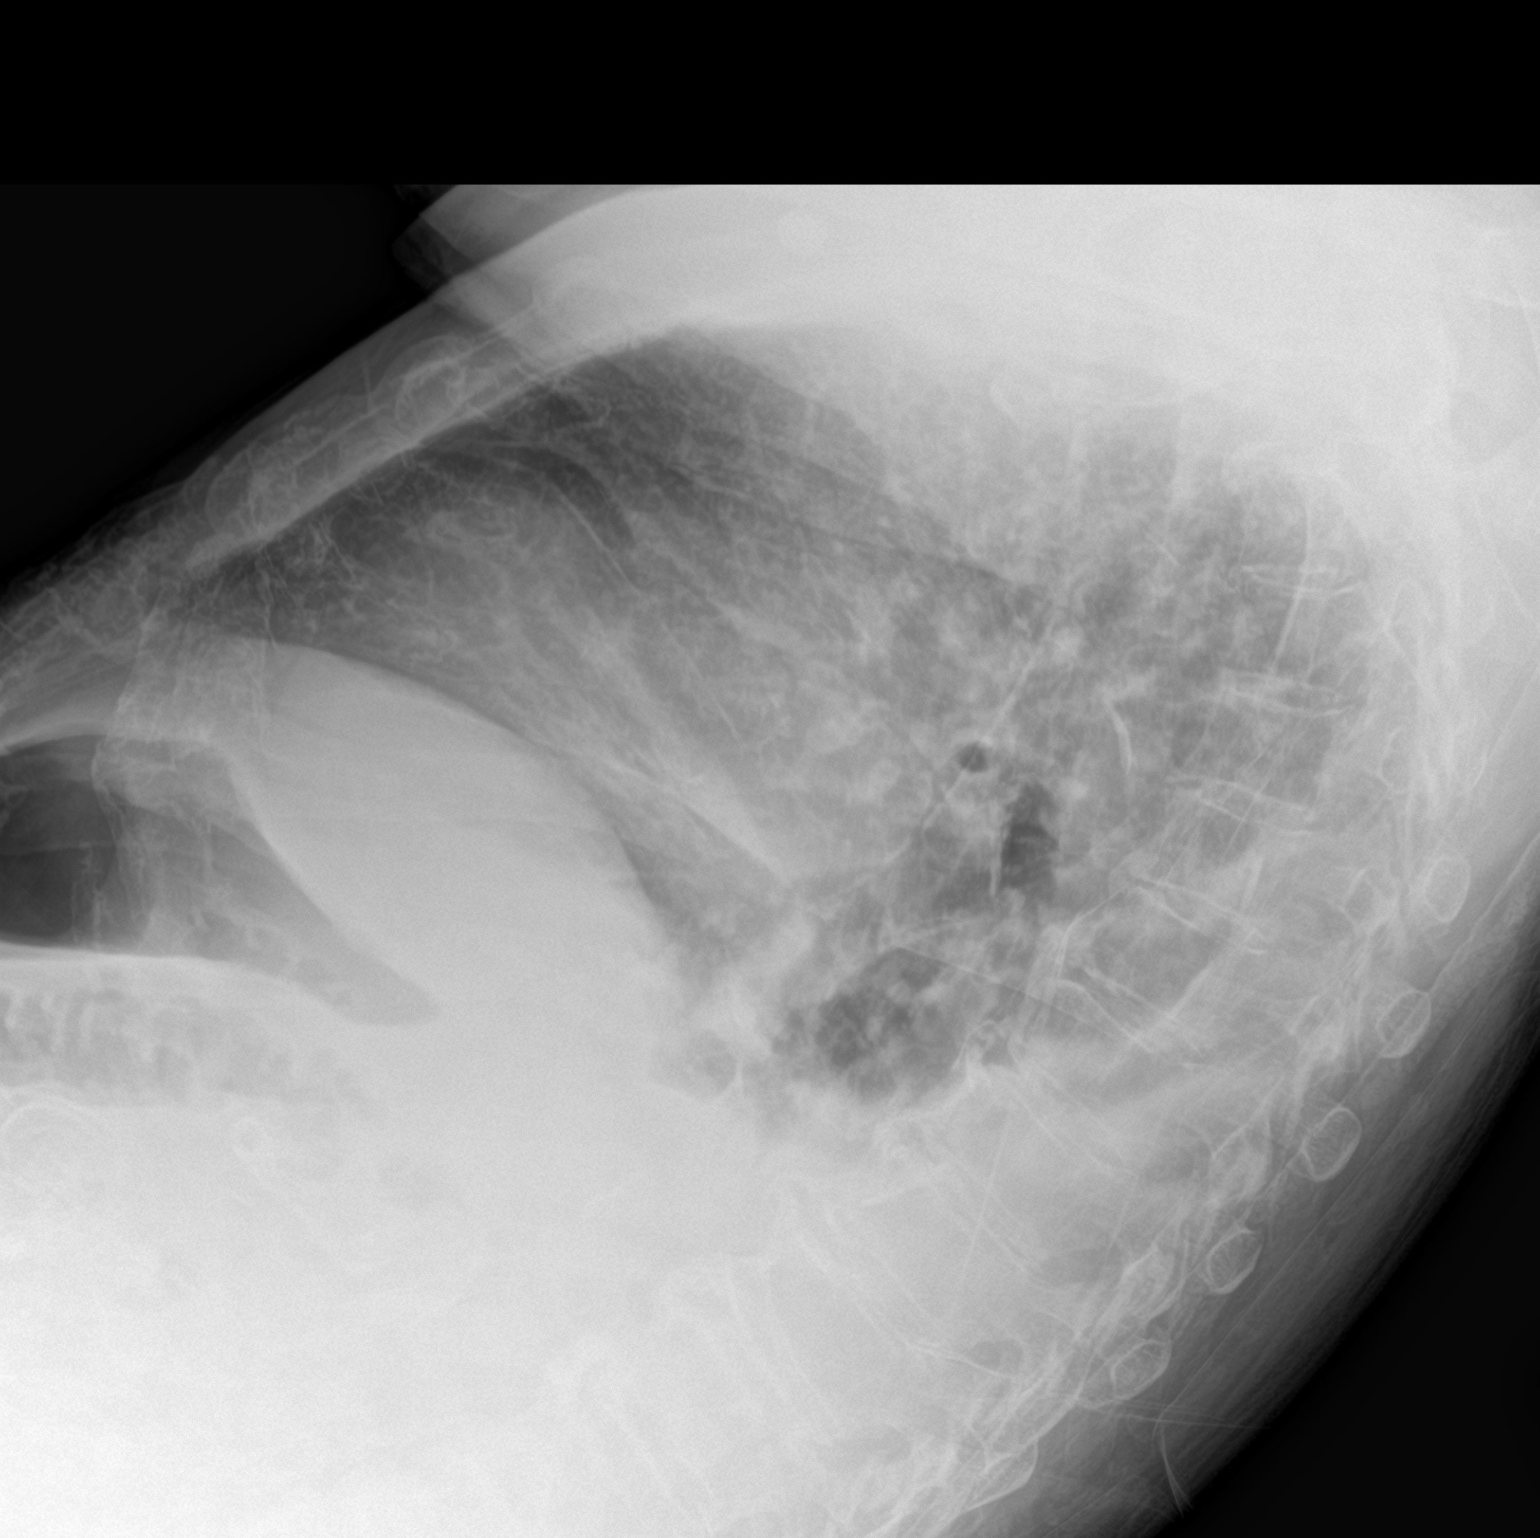

[2 of 2 positions shown; findings below may reference images not displayed]

FINDINGS: Cardiac silhouette is upper limits of normal in size. Calcified
aortic arch. Small RIGHT pleural effusion. Bronchitic
changes/interstitial changes. No pneumothorax. Soft tissue planes
and included osseous structures are non suspicious.
IMPRESSION: Small RIGHT pleural effusion.

Similar bronchitic changes/chronic interstitial lung disease.

Aortic Atherosclerosis ([id number]).
